# Patient Record
Sex: Male | Born: 1965 | Race: White | Hispanic: No | Marital: Married | Smoking: Former smoker
Health system: Southern US, Community
[De-identification: ages and names within clinical notes are randomized; demographics above are authoritative.]

## PROBLEM LIST (undated history)

## (undated) DIAGNOSIS — F909 Attention-deficit hyperactivity disorder, unspecified type: Secondary | ICD-10-CM

## (undated) DIAGNOSIS — G8929 Other chronic pain: Secondary | ICD-10-CM

## (undated) DIAGNOSIS — M549 Dorsalgia, unspecified: Secondary | ICD-10-CM

## (undated) DIAGNOSIS — F1110 Opioid abuse, uncomplicated: Secondary | ICD-10-CM

## (undated) DIAGNOSIS — F32A Depression, unspecified: Secondary | ICD-10-CM

## (undated) DIAGNOSIS — F329 Major depressive disorder, single episode, unspecified: Secondary | ICD-10-CM

## (undated) HISTORY — DX: Depression, unspecified: F32.A

## (undated) HISTORY — DX: Attention-deficit hyperactivity disorder, unspecified type: F90.9

## (undated) HISTORY — DX: Major depressive disorder, single episode, unspecified: F32.9

## (undated) HISTORY — PX: MEDIAL PARTIAL KNEE REPLACEMENT: SHX5965

## (undated) HISTORY — PX: APPENDECTOMY: SHX54

---

## 2005-02-11 ENCOUNTER — Ambulatory Visit: Payer: Self-pay | Admitting: Internal Medicine

## 2006-04-04 ENCOUNTER — Emergency Department (HOSPITAL_COMMUNITY): Admission: EM | Admit: 2006-04-04 | Discharge: 2006-04-04 | Payer: Self-pay | Admitting: Emergency Medicine

## 2006-04-06 ENCOUNTER — Ambulatory Visit: Payer: Self-pay | Admitting: Internal Medicine

## 2006-04-11 ENCOUNTER — Ambulatory Visit: Payer: Self-pay | Admitting: Internal Medicine

## 2006-04-11 ENCOUNTER — Ambulatory Visit (HOSPITAL_COMMUNITY): Admission: RE | Admit: 2006-04-11 | Discharge: 2006-04-11 | Payer: Self-pay | Admitting: Internal Medicine

## 2006-04-11 ENCOUNTER — Encounter (INDEPENDENT_AMBULATORY_CARE_PROVIDER_SITE_OTHER): Payer: Self-pay | Admitting: Specialist

## 2007-11-17 ENCOUNTER — Ambulatory Visit (HOSPITAL_COMMUNITY): Admission: RE | Admit: 2007-11-17 | Discharge: 2007-11-17 | Payer: Self-pay | Admitting: Preventative Medicine

## 2008-01-10 ENCOUNTER — Encounter (HOSPITAL_COMMUNITY): Admission: RE | Admit: 2008-01-10 | Discharge: 2008-02-09 | Payer: Self-pay | Admitting: Anesthesiology

## 2008-02-12 ENCOUNTER — Encounter (HOSPITAL_COMMUNITY): Admission: RE | Admit: 2008-02-12 | Discharge: 2008-03-13 | Payer: Self-pay | Admitting: Anesthesiology

## 2008-03-15 ENCOUNTER — Encounter (HOSPITAL_COMMUNITY): Admission: RE | Admit: 2008-03-15 | Discharge: 2008-03-27 | Payer: Self-pay | Admitting: Anesthesiology

## 2008-03-28 ENCOUNTER — Encounter: Admission: RE | Admit: 2008-03-28 | Discharge: 2008-04-27 | Payer: Self-pay | Admitting: Anesthesiology

## 2008-12-12 ENCOUNTER — Encounter: Admission: RE | Admit: 2008-12-12 | Discharge: 2008-12-12 | Payer: Self-pay | Admitting: Neurosurgery

## 2010-07-12 ENCOUNTER — Emergency Department (HOSPITAL_COMMUNITY)
Admission: EM | Admit: 2010-07-12 | Discharge: 2010-07-12 | Payer: Self-pay | Source: Home / Self Care | Admitting: Emergency Medicine

## 2010-11-13 NOTE — Consult Note (Signed)
NAME:  Marc [name] [name]             ACCOUNT NO.:  [id number]   MEDICAL RECORD NO.:  [id number]          PATIENT TYPE:  AMB   LOCATION:                                FACILITY:  [facility]   PHYSICIAN:  Lionel December, M.D.    DATE OF BIRTH:  07-14-1965   DATE OF CONSULTATION:  04/06/2006  DATE OF DISCHARGE:                                   CONSULTATION   PRESENTING COMPLAINT:  [name], abdominal pain Marc [name] rectal bleeding.   HISTORY OF PRESENT ILLNESS:  Marc [name] [name] is a 45 year old Caucasian male who is  referred through the courtesy of Dr. Phillips Odor for GI evaluation.   The patient was seen by me on February 11, 2005, for [name], LLQ abdominal  pain Marc [name] [name].  His stool studies were negative.  His CBC was  normal.  He responded to a course of Cipro Marc [name] Flagyl.  We therefore did not  pursue further workup.  He says gradually his symptoms have come back Marc [name]  they have been worse.  He was treated with Cipro Marc [name] Flagyl Marc [name] his symptoms  resolved.  He did have a followup appointment on March 23, 2005, but was  no show.   The patient states his symptoms have been gradually coming back Marc [name] getting  worse.  Now he is having at least 3-5 stools per day.  Stools are loose,  watery.  He has nocturnal [name].  He has crampy pain along the left upper  Marc [name] left lower quadrant of his abdomen.  He passes anywhere from a small to  moderate to large amount of blood with some of his bowel movements.  Bleeding does not occur every day.  He passes fresh blood or clots.  He  denies nausea, vomiting, fever, chills.  He has lost 8 pounds since his last  visit.  He was seen in the emergency room on April 04, 2006.  He had a  normal CBC.  He had an abdominopelvic CT which did not reveal any bowel wall  thickening.  He had tiny bilateral nonobstructing renal calculi.  He is  presently on medical leave which will run out end of this week.   He is on Xanax 1 mg q.h.s..   PAST MEDICAL HISTORY:  History of ADD  diagnosed 11 years ago, Marc [name] he has  been off Adderall for about a year Marc [name] doing well.   He had a laparotomy with drainage of appendiceal abscess in Nov 03, 1990.   ALLERGIES:  No known allergies.   FAMILY HISTORY:  Mother is 84 years old, has heart disease Marc [name] not doing  well.  The patient does not have any siblings.  Father's health status  unknown.   SOCIAL HISTORY:  He is divorced.  He has two children.  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  Previously he was working  at [employer]  He has never smoked cigarettes Marc [name] does not  drink alcohol.   PHYSICAL EXAMINATION:  GENERAL:  Pleasant, well-developed, well-nourished  Caucasian male who is in no acute distress.  He weighs 215 pounds.  He is 5  feet 10-Marc [name]-a-half inches tall.  Pulse 80 per minute, blood pressure 128/76,  temperature is 97.8.  HEENT:  Conjunctivae are pink.  Sclerae are nonicteric.  Oral pharyngeal  mucosa is normal.  No neck masses are noted.  LUNGS:  Clear to auscultation.  CARDIAC:  Regular rhythm.  Normal S1 Marc [name] S2.  No murmur or gallop noted.  ABDOMEN:  Symmetrical, bowel sounds are normal.  Palpation reveals mild  tenderness at LLQ Marc [name] at RLQ.  No organomegaly or masses.  RECTAL:  Deferred, as he had one in emergency room earlier this week.  EXTREMITIES:  No clubbing or edema noted.   CBC from April 04, 2006:  WBC is 6.4; H&H is 15.6 Marc [name] 44.8; platelet count  is 185,000.  Diff is normal, EO count was 2%.   ASSESSMENT:  Marc [name] [name],  Marc [name] [name], Marc [name] [name].  Over a year ago he was evaluated with negative stool studies Marc [name] responded  to empiric trial of Cipro Marc [name] Flagyl.  Given his recurrent symptoms,  inflammatory bowel disease needs to be ruled out although he could have IBS  Marc [name] hemorrhoids to account for this symptomatology.   RECOMMENDATIONS:  1. Dicyclomine 10 mg  before each meal.  Prescription given for #60 with a      refill.  2. Colonoscopy Marc [name] terminal ileoscopy to be performed at [facility] in the near      future.   The patient's medical leave was extended to April 18, 2006.  Hopefully his  evaluation would be complete by then Marc [name] he should be able to return to work  by that time.   We would like to thank Dr. Phillips Odor for the opportunity to participate in the  care of this gentleman.      Lionel December, M.D.  Electronically Signed     NR/MEDQ  D:  04/06/2006  T:  04/07/2006  Job:  [id number]   cc:   Corrie Mckusick, M.D.  Fax: [phone]

## 2010-11-13 NOTE — Op Note (Signed)
NAME:  Marc [name], Marc [name]             ACCOUNT NO.:  [id number]   MEDICAL RECORD NO.:  [id number]          PATIENT TYPE:  AMB   LOCATION:  DAY                           FACILITY:  [facility]   PHYSICIAN:  Lionel December, M.D.    DATE OF BIRTH:  February 17, 1966   DATE OF PROCEDURE:  04/11/2006  DATE OF DISCHARGE:                                 OPERATIVE REPORT   PROCEDURE:  Colonoscopy with polypectomy.   INDICATIONS:  Marc [name] is a 45 year old Caucasian male with diarrhea, left-sided  abdominal pain who was also been having hematochezia.  He is undergoing  diagnostic colonoscopy.  Procedure and risks were reviewed with the patient,  and informed consent was obtained.   MEDICATIONS FOR CONSCIOUS SEDATION:  Demerol 50 mg IV, Versed 8 mg IV.   FINDINGS:  Procedure performed in endoscopy suite.  The patient's vital  signs and O2 saturation were monitored during procedure and remained stable.  The patient was placed in left lateral position and rectal examination  performed.  No abnormality noted on external or digital exam.  Olympus  videoscope was placed in rectum and advanced under vision into sigmoid colon  and beyond.  Preparation was excellent.  Scope was passed into cecum which  was identified by ileocecal valve and appendiceal orifice.  Pictures taken  for the record.  Short segment of TI was also examined and was normal.  Pictures taken for the record.  As the scope was withdrawn, colonic mucosa  was carefully examined.  There were three polyps; a small polyp at proximal  transverse colon which was cold snared and retrieved for histologic  examination using biopsy forceps; there was an 8- to 10-mm polyp at distal  transverse colon which was snared and retrieved for histologic examination;  there was a third large pedunculated polyp with a hemorrhagic surface about  15 mm in diameter which was snared and retrieved for histologic examination.  Mucosa of colon was normal without any abnormalities to  suggest colitis.  Rectal mucosa similarly was normal.  Scope was retroflexed to examine  anorectal junction, and small hemorrhoids were noted below the dentate line.  Endoscope was straightened and withdrawn.  The patient tolerated the  procedure well.   FINAL DIAGNOSIS:  1. No evidence of terminal ileitis or colitis.  2. Three polyps removed; a small one from proximal transverse colon via      cold snare, 8- to 10-mm polyp snared from distal transverse colon and a      15-mm pedunculated polyp with hemorrhagic surface snared from distal      sigmoid colon.  3. Some small external hemorrhoids.   Suspect his rectal bleeding is secondary to this large polyp.  It remains to  be seen whether or not this pain would be alleviated.   I suspect he has irritable bowel syndrome.   RECOMMENDATIONS:  Standard instructions given.  High-fiber diet.  He will  continue dicyclomine 10 mg t.i.d.  I will be contacting patient with results  of biopsy and further recommendations.      Lionel December, M.D.  Electronically Signed  NR/MEDQ  D:  04/11/2006  T:  04/12/2006  Job:  [id number]   cc:   Corrie Mckusick, M.D.  Fax: [phone]

## 2012-12-07 ENCOUNTER — Other Ambulatory Visit (HOSPITAL_COMMUNITY): Payer: Self-pay | Admitting: Family Medicine

## 2012-12-07 ENCOUNTER — Ambulatory Visit (HOSPITAL_COMMUNITY)
Admission: RE | Admit: 2012-12-07 | Discharge: 2012-12-07 | Disposition: A | Discharge status: Home / Self Care | Payer: Self-pay | Source: Ambulatory Visit | Attending: Family Medicine | Admitting: Family Medicine

## 2012-12-07 DIAGNOSIS — M545 Low back pain, unspecified: Secondary | ICD-10-CM | POA: Insufficient documentation

## 2012-12-07 DIAGNOSIS — M4800 Spinal stenosis, site unspecified: Secondary | ICD-10-CM

## 2012-12-07 DIAGNOSIS — M542 Cervicalgia: Secondary | ICD-10-CM | POA: Insufficient documentation

## 2012-12-07 DIAGNOSIS — M47817 Spondylosis without myelopathy or radiculopathy, lumbosacral region: Secondary | ICD-10-CM | POA: Insufficient documentation

## 2012-12-07 DIAGNOSIS — M47812 Spondylosis without myelopathy or radiculopathy, cervical region: Secondary | ICD-10-CM | POA: Insufficient documentation

## 2013-01-02 ENCOUNTER — Emergency Department (HOSPITAL_COMMUNITY)
Admission: EM | Admit: 2013-01-02 | Discharge: 2013-01-02 | Disposition: A | Discharge status: Home / Self Care | Payer: Self-pay | Attending: Emergency Medicine | Admitting: Emergency Medicine

## 2013-01-02 ENCOUNTER — Encounter (HOSPITAL_COMMUNITY): Payer: Self-pay | Admitting: *Deleted

## 2013-01-02 DIAGNOSIS — R5381 Other malaise: Secondary | ICD-10-CM | POA: Insufficient documentation

## 2013-01-02 DIAGNOSIS — F1123 Opioid dependence with withdrawal: Secondary | ICD-10-CM

## 2013-01-02 DIAGNOSIS — F19939 Other psychoactive substance use, unspecified with withdrawal, unspecified: Secondary | ICD-10-CM | POA: Insufficient documentation

## 2013-01-02 DIAGNOSIS — Z79899 Other long term (current) drug therapy: Secondary | ICD-10-CM | POA: Insufficient documentation

## 2013-01-02 DIAGNOSIS — Z87891 Personal history of nicotine dependence: Secondary | ICD-10-CM | POA: Insufficient documentation

## 2013-01-02 DIAGNOSIS — G8929 Other chronic pain: Secondary | ICD-10-CM | POA: Insufficient documentation

## 2013-01-02 DIAGNOSIS — R110 Nausea: Secondary | ICD-10-CM | POA: Insufficient documentation

## 2013-01-02 DIAGNOSIS — R252 Cramp and spasm: Secondary | ICD-10-CM | POA: Insufficient documentation

## 2013-01-02 HISTORY — DX: Other chronic pain: G89.29

## 2013-01-02 HISTORY — DX: Dorsalgia, unspecified: M54.9

## 2013-01-02 HISTORY — DX: Opioid abuse, uncomplicated: F11.10

## 2013-01-02 LAB — BASIC METABOLIC PANEL
BUN: 15 mg/dL (ref 6–23)
CO2: 24 mEq/L (ref 19–32)
Chloride: 101 mEq/L (ref 96–112)
Glucose, Bld: 152 mg/dL — ABNORMAL HIGH (ref 70–99)
Potassium: 3.7 mEq/L (ref 3.5–5.1)

## 2013-01-02 LAB — CBC WITH DIFFERENTIAL/PLATELET
Basophils Relative: 0 % (ref 0–1)
Eosinophils Absolute: 0 10*3/uL (ref 0.0–0.7)
HCT: 45.3 % (ref 39.0–52.0)
Hemoglobin: 15.5 g/dL (ref 13.0–17.0)
MCH: 29.1 pg (ref 26.0–34.0)
MCHC: 34.2 g/dL (ref 30.0–36.0)
MCV: 85 fL (ref 78.0–100.0)
Monocytes Absolute: 0.5 10*3/uL (ref 0.1–1.0)
Monocytes Relative: 6 % (ref 3–12)

## 2013-01-02 LAB — RAPID URINE DRUG SCREEN, HOSP PERFORMED
Cocaine: NOT DETECTED
Opiates: NOT DETECTED
Tetrahydrocannabinol: POSITIVE — AB

## 2013-01-02 LAB — ACETAMINOPHEN LEVEL: Acetaminophen (Tylenol), Serum: <15 ug/mL (ref 10–30)

## 2013-01-02 LAB — SALICYLATE LEVEL: Salicylate Lvl: <2 mg/dL — ABNORMAL LOW (ref 2.8–20.0)

## 2013-01-02 MED ORDER — ACETAMINOPHEN 325 MG PO TABS: 650.0000 mg | ORAL_TABLET | ORAL | Status: DC | PRN

## 2013-01-02 MED ORDER — LORAZEPAM 1 MG PO TABS: 1.0000 mg | ORAL_TABLET | Freq: Three times a day (TID) | ORAL | Status: DC | PRN

## 2013-01-02 MED ORDER — METHOCARBAMOL 500 MG PO TABS: 500.0000 mg | ORAL_TABLET | Freq: Three times a day (TID) | ORAL | Status: DC | PRN

## 2013-01-02 MED ORDER — CLONIDINE HCL 0.1 MG PO TABS
0.1000 mg | ORAL_TABLET | Freq: Once | ORAL | Status: AC
  Administered 2013-01-02: 0.1 mg via ORAL
  Filled 2013-01-02: qty 1

## 2013-01-02 MED ORDER — ONDANSETRON 8 MG PO TBDP
8.0000 mg | ORAL_TABLET | Freq: Once | ORAL | Status: AC
  Administered 2013-01-02: 8 mg via ORAL
  Filled 2013-01-02: qty 1

## 2013-01-02 MED ORDER — CLONIDINE HCL 0.1 MG PO TABS
0.1000 mg | ORAL_TABLET | Freq: Four times a day (QID) | ORAL | Status: DC
  Administered 2013-01-02: 0.1 mg via ORAL
  Filled 2013-01-02: qty 1

## 2013-01-02 MED ORDER — CLONIDINE HCL 0.1 MG PO TABS: 0.1000 mg | ORAL_TABLET | ORAL | Status: DC

## 2013-01-02 MED ORDER — LOPERAMIDE HCL 2 MG PO CAPS: 2.0000 mg | ORAL_CAPSULE | ORAL | Status: DC | PRN

## 2013-01-02 MED ORDER — HYDROXYZINE HCL 25 MG PO TABS: 25.0000 mg | ORAL_TABLET | Freq: Four times a day (QID) | ORAL | Status: DC | PRN

## 2013-01-02 MED ORDER — IBUPROFEN 400 MG PO TABS: 400.0000 mg | ORAL_TABLET | Freq: Three times a day (TID) | ORAL | Status: DC | PRN

## 2013-01-02 MED ORDER — NICOTINE 21 MG/24HR TD PT24: 21.0000 mg | MEDICATED_PATCH | Freq: Every day | TRANSDERMAL | Status: DC | PRN

## 2013-01-02 MED ORDER — CLONIDINE HCL 0.1 MG PO TABS: 0.1000 mg | ORAL_TABLET | Freq: Every day | ORAL | Status: DC

## 2013-01-02 MED ORDER — ALUM & MAG HYDROXIDE-SIMETH 200-200-20 MG/5ML PO SUSP: 30.0000 mL | ORAL | Status: DC | PRN

## 2013-01-02 MED ORDER — ONDANSETRON HCL 4 MG PO TABS: 4.0000 mg | ORAL_TABLET | Freq: Three times a day (TID) | ORAL | Status: DC | PRN

## 2013-01-02 MED ORDER — DICYCLOMINE HCL 10 MG PO CAPS: 20.0000 mg | ORAL_CAPSULE | Freq: Four times a day (QID) | ORAL | Status: DC | PRN

## 2013-01-02 NOTE — BH Assessment (Signed)
Assessment Note   Marc [name] is an 47 y.o. male. PT REPORTED TO THE ER SEEKING OPIATE DETOX.  HE REPORTS HIS ONLY WITHDRAWAL IS BODY ACHES.  HE HAS NOT USED IN 2 DAYS AND WOULD PREFER A SUBOXEN PROGRAM OR OUT PT TREATMENT.  PT MADE AWARE OF SUBOXEN PROGRAMS WITH DR REDDY AND THE METHADONE PROGRAM.  PT REPORTS HE WOULD GO BACK TO [facility] FOR THE GROUP THERAPY THAT DR Duayne Cal AT [facility] HAS ALREADY RECOMMENDED FOR HIM.  PT DENIES S/I, H/I AND IS NOT PSYCHOTIC.        Axis I: Substance Abuse, OPIATE DEPENDENCE Axis II: Deferred Axis III:  Past Medical History  Diagnosis Date  . Back pain   . Chronic pain   . Opiate abuse, continuous    Axis IV: problems with access to health care services Axis V: 51-60 moderate symptoms  Past Medical History:  Past Medical History  Diagnosis Date  . Back pain   . Chronic pain   . Opiate abuse, continuous     Past Surgical History  Procedure Laterality Date  . Appendectomy    . Medial partial knee replacement Left     Family History: History reviewed. No pertinent family history.  Social History:  reports that he has quit smoking. His smokeless tobacco use includes Snuff. He reports that he uses illicit drugs (Marijuana). He reports that he does not drink alcohol.  Additional Social History:  Alcohol / Drug Use Pain Medications: yes Prescriptions: yes Over the Counter: no History of alcohol / drug use?: Yes Substance #1 Name of Substance 1: opiates 1 - Age of First Use: 43 1 - Amount (size/oz): 8-10 hydrocodone 1 - Frequency: daily 1 - Duration: 3 yrs 1 - Last Use / Amount: 2 days ago  CIWA: CIWA-Ar BP: 115/90 mmHg Pulse Rate: 92 COWS:    Allergies: No Known Allergies  Home Medications:  (Not in a hospital admission)  OB/GYN Status:  No LMP for male patient.  General Assessment Data Location of Assessment: [facility] ED ACT Assessment: Yes Living Arrangements: Spouse/significant other Can pt return to current living  arrangement?: Yes Admission Status: Voluntary Is patient capable of signing voluntary admission?: Yes Transfer from: Acute Hospital Referral Source: MD     Risk to self Suicidal Ideation: No Suicidal Intent: No Is patient at risk for suicide?: No Suicidal Plan?: No Access to Means: No What has been your use of drugs/alcohol within the last 12 months?: opiates Previous Attempts/Gestures: No How many times?: 0 Other Self Harm Risks: na Triggers for Past Attempts: None known Intentional Self Injurious Behavior: None Family Suicide History: Unknown Recent stressful life event(s): Recent negative physical changes (chronic back pain  worse at times) Persecutory voices/beliefs?: No Depression: Yes Depression Symptoms: Loss of interest in usual pleasures;Isolating;Fatigue Substance abuse history and/or treatment for substance abuse?: Yes Suicide prevention information given to non-admitted patients: Yes  Risk to Others Homicidal Ideation: No Thoughts of Harm to Others: No Current Homicidal Intent: No Current Homicidal Plan: No Access to Homicidal Means: No History of harm to others?: No Assessment of Violence: None Noted Does patient have access to weapons?: No Criminal Charges Pending?: No Does patient have a court date: No  Psychosis Hallucinations: None noted Delusions: None noted  Mental Status Report Appear/Hygiene: Improved Eye Contact: Good Motor Activity: Freedom of movement Speech: Logical/coherent Level of Consciousness: Alert Mood: Depressed Affect: Appropriate to circumstance;Depressed Anxiety Level: Minimal Thought Processes: Coherent;Relevant Judgement: Unimpaired Orientation: Person;Place;Time;Situation Obsessive Compulsive Thoughts/Behaviors: None  Cognitive Functioning Concentration: Normal Memory: Recent Intact;Remote Intact IQ: Average Insight: Fair Impulse Control: Poor Appetite: Good Weight Loss: 0 Weight Gain: 0 Sleep: No Change Total  Hours of Sleep: 8 (with xanax) Vegetative Symptoms: None  ADLScreening [facility] Assessment Services) Patient's cognitive ability adequate to safely complete daily activities?: Yes Patient able to express need for assistance with ADLs?: Yes Independently performs ADLs?: No  Abuse/Neglect [facility]) Physical Abuse: Denies Verbal Abuse: Denies Sexual Abuse: Denies  Prior Inpatient Therapy Prior Inpatient Therapy: No Prior Therapy Dates: na Prior Therapy Facilty/Provider(s): na  Prior Outpatient Therapy Prior Outpatient Therapy: Yes Prior Therapy Dates: currently Prior Therapy Facilty/Provider(s): [facility] Reason for Treatment: depression and addiction pxs  ADL Screening (condition at time of admission) Patient's cognitive ability adequate to safely complete daily activities?: Yes Is the patient deaf or have difficulty hearing?: Yes Does the patient have difficulty seeing, even when wearing glasses/contacts?: Yes Does the patient have difficulty concentrating, remembering, or making decisions?: Yes Patient able to express need for assistance with ADLs?: Yes Does the patient have difficulty dressing or bathing?: Yes Independently performs ADLs?: No Weakness of Legs: None Weakness of Arms/Hands: None  Home Assistive Devices/Equipment Home Assistive Devices/Equipment: None  Therapy Consults (therapy consults require a physician order) PT Evaluation Needed: No OT Evalulation Needed: No SLP Evaluation Needed: No Abuse/Neglect Assessment (Assessment to be complete while patient is alone) Physical Abuse: Denies Verbal Abuse: Denies Sexual Abuse: Denies Exploitation of patient/patient's resources: Denies Self-Neglect: Denies Values / Beliefs Cultural Requests During Hospitalization: None Spiritual Requests During Hospitalization: None Consults Spiritual Care Consult Needed: No Social Work Consult Needed: No Merchant navy officer (For Healthcare) Advance Directive: Patient does not have  advance directive;Patient would not like information Pre-existing out of facility DNR order (yellow form or pink MOST form): No    Additional Information 1:1 In Past 12 Months?: No CIRT Risk: No Elopement Risk: No Does patient have medical clearance?: Yes     Disposition:  Disposition Initial Assessment Completed for this Encounter: Yes Disposition of Patient: Referred to Patient referred to: Other (Comment) [facility])  On Site Evaluation by:   Reviewed with Physician:     Marc [name] 01/02/2013 7:19 PM

## 2013-01-02 NOTE — ED Notes (Signed)
Patient seeking help w/rehab from Rx pain medication/marijuana.  Has been on pain meds for 4 years, originally for back pain.

## 2013-01-02 NOTE — ED Provider Notes (Signed)
History    CSN: [id number] Arrival date & time 01/02/13  1327  First MD Initiated Contact with Patient 01/02/13 1404     Chief Complaint  Patient presents with  . Medical Clearance    HPI Pt was seen at 1400. Per pt and his wife, c/o gradual onset and persistence of constant opiate abuse for the past 4 years. Pt states he has been abusing his chronic pain medication prescriptions (vicodin, percocet).  Pt is here requesting detox. States his LD opiates was 2 days ago. Endorses generalized fatigue, muscles "cramping," and nausea. States he "can't get up off the couch" and "I can't do this on my own."  Denies SI, no SA, no HI, no hallucinations. Denies vomiting/diarrhea, no CP/SOB, no abd pain.    Past Medical History  Diagnosis Date  . Back pain   . Chronic pain   . Opiate abuse, continuous    Past Surgical History  Procedure Laterality Date  . Appendectomy    . Medial partial knee replacement Left     History  Substance Use Topics  . Smoking status: Former Games developer  . Smokeless tobacco: Current User    Types: Snuff  . Alcohol Use: No    Review of Systems ROS: Statement: All systems negative except as marked or noted in the HPI; Constitutional: Negative for fever and chills. ; ; Eyes: Negative for eye pain, redness and discharge. ; ; ENMT: Negative for ear pain, hoarseness, nasal congestion, sinus pressure and sore throat. ; ; Cardiovascular: Negative for chest pain, palpitations, diaphoresis, dyspnea and peripheral edema. ; ; Respiratory: Negative for cough, wheezing and stridor. ; ; Gastrointestinal: +nausea. Negative for vomiting, diarrhea, abdominal pain, blood in stool, hematemesis, jaundice and rectal bleeding. . ; ; Genitourinary: Negative for dysuria, flank pain and hematuria. ; ; Musculoskeletal: Negative for back pain and neck pain. Negative for swelling and trauma.; ; Skin: Negative for pruritus, rash, abrasions, blisters, bruising and skin lesion.; ; Neuro: Negative for  headache, lightheadedness and neck stiffness. Negative for weakness, altered level of consciousness , altered mental status, extremity weakness, paresthesias, involuntary movement, seizure and syncope.; Psych:  No SI, no SA, no HI, no hallucinations.      Allergies  Review of patient's allergies indicates no known allergies.  Home Medications   Current Outpatient Rx  Name  Route  Sig  Dispense  Refill  . ALPRAZolam (XANAX) 1 MG tablet   Oral   Take 1-2 mg by mouth 3 (three) times daily.         . DULoxetine (CYMBALTA) 60 MG capsule   Oral   Take 60 mg by mouth daily.          BP 115/90  Pulse 92  Temp(Src) 97.7 F (36.5 C) (Oral)  Resp 20  Ht 5\' 10"  (1.778 m)  Wt 227 lb 2 oz (103.023 kg)  BMI 32.59 kg/m2  SpO2 100% Physical Exam Physical examination:  Nursing notes reviewed; Vital signs and O2 SAT reviewed;  Constitutional: Well developed, Well nourished, Well hydrated, In no acute distress; Head:  Normocephalic, atraumatic; Eyes: EOMI, PERRL, No scleral icterus; ENMT: Mouth and pharynx normal, Mucous membranes moist; Neck: Supple, Full range of motion; Cardiovascular: Regular rate and rhythm; Respiratory: Breath sounds clear, no wheezes.  Speaking full sentences with ease, Normal respiratory effort/excursion; Chest: No deformity, Movement normal; Abdomen: Nondistended, Normal bowel sounds;; Extremities:  No deformity. No edema, No calf edema or asymmetry.; Neuro: AA&Ox3, Major CN grossly intact.  Speech clear. No  gross focal motor or sensory deficits in extremities.; Skin: Color normal, Warm, Dry.; Psych:  Affect flat, poor eye contact. Denies SI, no SA.    ED Course  Procedures   MDM  MDM Reviewed: previous chart, nursing note and vitals Interpretation: labs   Results for orders placed during the hospital encounter of 01/02/13  ACETAMINOPHEN LEVEL      Result Value Range   Acetaminophen (Tylenol), Serum <15.0  10 - 30 ug/mL  SALICYLATE LEVEL      Result Value  Range   Salicylate Lvl <2.0 (*) 2.8 - 20.0 mg/dL  ETHANOL      Result Value Range   Alcohol, Ethyl (B) <11  0 - 11 mg/dL  BASIC METABOLIC PANEL      Result Value Range   Sodium 137  135 - 145 mEq/L   Potassium 3.7  3.5 - 5.1 mEq/L   Chloride 101  96 - 112 mEq/L   CO2 24  19 - 32 mEq/L   Glucose, Bld 152 (*) 70 - 99 mg/dL   BUN 15  6 - 23 mg/dL   Creatinine, Ser 1.61  0.50 - 1.35 mg/dL   Calcium 9.8  8.4 - 09.6 mg/dL   GFR calc non Af Amer >90  >90 mL/min   GFR calc Af Amer >90  >90 mL/min  CBC WITH DIFFERENTIAL      Result Value Range   WBC 8.8  4.0 - 10.5 K/uL   RBC 5.33  4.22 - 5.81 MIL/uL   Hemoglobin 15.5  13.0 - 17.0 g/dL   HCT 04.5  40.9 - 81.1 %   MCV 85.0  78.0 - 100.0 fL   MCH 29.1  26.0 - 34.0 pg   MCHC 34.2  30.0 - 36.0 g/dL   RDW 91.4  78.2 - 95.6 %   Platelets 224  150 - 400 K/uL   Neutrophils Relative % 77  43 - 77 %   Neutro Abs 6.7  1.7 - 7.7 K/uL   Lymphocytes Relative 17  12 - 46 %   Lymphs Abs 1.5  0.7 - 4.0 K/uL   Monocytes Relative 6  3 - 12 %   Monocytes Absolute 0.5  0.1 - 1.0 K/uL   Eosinophils Relative 0  0 - 5 %   Eosinophils Absolute 0.0  0.0 - 0.7 K/uL   Basophils Relative 0  0 - 1 %   Basophils Absolute 0.0  0.0 - 0.1 K/uL     1615:  ACT Ella to eval. Clonidine and zofran given. Holding orders and clonidine detox protocol ordered.   Laray Anger, DO 01/02/13 [phone]

## 2013-11-30 ENCOUNTER — Other Ambulatory Visit (HOSPITAL_COMMUNITY): Payer: Self-pay | Admitting: Physician Assistant

## 2013-11-30 DIAGNOSIS — G8929 Other chronic pain: Secondary | ICD-10-CM

## 2013-11-30 DIAGNOSIS — M5412 Radiculopathy, cervical region: Secondary | ICD-10-CM

## 2013-12-03 ENCOUNTER — Ambulatory Visit (HOSPITAL_COMMUNITY)
Admission: RE | Admit: 2013-12-03 | Discharge: 2013-12-03 | Disposition: A | Discharge status: Home / Self Care | Payer: 59 | Source: Ambulatory Visit | Attending: Physician Assistant | Admitting: Physician Assistant

## 2013-12-03 DIAGNOSIS — M5412 Radiculopathy, cervical region: Secondary | ICD-10-CM

## 2013-12-03 DIAGNOSIS — G8929 Other chronic pain: Secondary | ICD-10-CM

## 2013-12-03 DIAGNOSIS — M79609 Pain in unspecified limb: Secondary | ICD-10-CM | POA: Insufficient documentation

## 2013-12-03 DIAGNOSIS — M542 Cervicalgia: Secondary | ICD-10-CM | POA: Insufficient documentation

## 2013-12-03 DIAGNOSIS — M5380 Other specified dorsopathies, site unspecified: Secondary | ICD-10-CM | POA: Insufficient documentation

## 2013-12-03 DIAGNOSIS — M5020 Other cervical disc displacement, unspecified cervical region: Secondary | ICD-10-CM | POA: Insufficient documentation

## 2013-12-06 ENCOUNTER — Ambulatory Visit (INDEPENDENT_AMBULATORY_CARE_PROVIDER_SITE_OTHER): Payer: 59 | Admitting: Orthopedic Surgery

## 2013-12-06 ENCOUNTER — Ambulatory Visit (INDEPENDENT_AMBULATORY_CARE_PROVIDER_SITE_OTHER): Payer: 59

## 2013-12-06 DIAGNOSIS — M2390 Unspecified internal derangement of unspecified knee: Secondary | ICD-10-CM

## 2013-12-06 DIAGNOSIS — M25562 Pain in left knee: Secondary | ICD-10-CM

## 2013-12-06 DIAGNOSIS — M25569 Pain in unspecified knee: Secondary | ICD-10-CM
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
## 2013-12-06 NOTE — Patient Instructions (Addendum)
MRI LEFT KNEE 

## 2013-12-31 ENCOUNTER — Ambulatory Visit (HOSPITAL_COMMUNITY): Payer: 59

## 2014-01-01 ENCOUNTER — Telehealth: Payer: Self-pay | Admitting: *Deleted

## 2014-01-01 ENCOUNTER — Ambulatory Visit (HOSPITAL_COMMUNITY): Payer: 59

## 2014-01-01 NOTE — Telephone Encounter (Signed)
NO

## 2014-01-01 NOTE — Telephone Encounter (Signed)
Call received from patient, who states he went for his MRI last night and started having back spasms and could not lay there. He is already on xanax 1 mg, and had taken that before he went last night. He was rescheduled for tonight, and is asking is there something else you can give him to help with the back spasms? Please advise

## 2014-01-01 NOTE — Telephone Encounter (Signed)
Patient informed of Dr. Harrison's reply. 

## 2014-01-02 ENCOUNTER — Telehealth: Payer: Self-pay | Admitting: Orthopedic Surgery

## 2014-01-02 NOTE — Telephone Encounter (Signed)
Patient called back, states was unable to go through the (re-scheduled) MRI at [facility] for left knee, due to back spasms, as mentioned in previous note.   He said he would like to hold on re-scheduling MRI, and said he feels he needs to check into having a spine specialist see him for his back spasms.  He has also cancelled his follow up appointment for tomorrow, 01/03/14, and said he will call back if wishes to re-schedule.

## 2014-01-03 ENCOUNTER — Ambulatory Visit: Payer: 59 | Admitting: Orthopedic Surgery

## 2014-01-03 NOTE — Telephone Encounter (Signed)
Noted  

## 2014-07-26 ENCOUNTER — Ambulatory Visit (HOSPITAL_COMMUNITY): Payer: Self-pay | Admitting: Psychiatry

## 2014-08-23 ENCOUNTER — Ambulatory Visit (HOSPITAL_COMMUNITY): Payer: Self-pay | Admitting: Psychiatry

## 2014-08-30 ENCOUNTER — Encounter (HOSPITAL_COMMUNITY): Payer: Self-pay | Admitting: Psychiatry

## 2014-08-30 ENCOUNTER — Telehealth (HOSPITAL_COMMUNITY): Payer: Self-pay | Admitting: *Deleted

## 2014-08-30 ENCOUNTER — Ambulatory Visit (INDEPENDENT_AMBULATORY_CARE_PROVIDER_SITE_OTHER): Payer: 59 | Admitting: Psychiatry

## 2014-08-30 VITALS — BP 126/78 | HR 75 | Ht 70.0 in | Wt 227.8 lb

## 2014-08-30 DIAGNOSIS — F909 Attention-deficit hyperactivity disorder, unspecified type: Secondary | ICD-10-CM | POA: Insufficient documentation

## 2014-08-30 DIAGNOSIS — F900 Attention-deficit hyperactivity disorder, predominantly inattentive type: Secondary | ICD-10-CM

## 2014-08-30 DIAGNOSIS — F329 Major depressive disorder, single episode, unspecified: Secondary | ICD-10-CM

## 2014-08-30 MED ORDER — AMPHETAMINE-DEXTROAMPHETAMINE 20 MG PO TABS: 20.0000 mg | ORAL_TABLET | Freq: Two times a day (BID) | ORAL | Status: DC

## 2014-08-30 NOTE — Telephone Encounter (Signed)
phone call from patient.   pharmacy said he need authorization for his Adderall.    Authorization phone # [phone].

## 2014-08-30 NOTE — Telephone Encounter (Signed)
Route to [place] please

## 2014-08-30 NOTE — Telephone Encounter (Signed)
Called pt and informed him that we will get started on his Prior Auth for his Adderall and pt shows understanding
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
## 2014-09-02 ENCOUNTER — Telehealth (HOSPITAL_COMMUNITY): Payer: Self-pay | Admitting: *Deleted

## 2014-09-02 NOTE — Telephone Encounter (Signed)
Prior authorization received for Amphetamine-Dextroamphetamine. Entered authorization with covermymeds.

## 2014-09-27 ENCOUNTER — Ambulatory Visit (INDEPENDENT_AMBULATORY_CARE_PROVIDER_SITE_OTHER): Payer: 59 | Admitting: Psychiatry

## 2014-09-27 ENCOUNTER — Encounter (HOSPITAL_COMMUNITY): Payer: Self-pay | Admitting: Psychiatry

## 2014-09-27 VITALS — BP 138/80 | Ht 70.0 in | Wt 219.0 lb

## 2014-09-27 DIAGNOSIS — F900 Attention-deficit hyperactivity disorder, predominantly inattentive type: Secondary | ICD-10-CM | POA: Diagnosis not present

## 2014-09-27 DIAGNOSIS — F329 Major depressive disorder, single episode, unspecified: Secondary | ICD-10-CM | POA: Diagnosis not present

## 2014-09-27 MED ORDER — AMPHETAMINE-DEXTROAMPHETAMINE 30 MG PO TABS: 30.0000 mg | ORAL_TABLET | Freq: Two times a day (BID) | ORAL | Status: DC
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
## 2014-10-03 ENCOUNTER — Telehealth (HOSPITAL_COMMUNITY): Payer: Self-pay | Admitting: *Deleted

## 2014-10-03 NOTE — Telephone Encounter (Signed)
Prior authorization received for amphetamine-dextroamphetamine. Completed online through covermymeds.

## 2014-10-08 ENCOUNTER — Telehealth (HOSPITAL_COMMUNITY): Payer: Self-pay | Admitting: *Deleted

## 2014-10-08 NOTE — Telephone Encounter (Signed)
Had to call covermymeds to resend Authorization. Will have response in 24 hours.

## 2014-10-22 ENCOUNTER — Telehealth (HOSPITAL_COMMUNITY): Payer: Self-pay | Admitting: *Deleted

## 2014-10-22 NOTE — Telephone Encounter (Signed)
Received a denial letter for Adderall and called to re-check since an approval had been given on 4/14. Spoke with Britta MccreedyBarbara who states it was approved and that was for an older request on 4/7. Prior authorization approval # [id number] will expire on 09/02/17. Pharmacy has sent the paid claim and they will be able to see approval.

## 2014-10-25 ENCOUNTER — Encounter (HOSPITAL_COMMUNITY): Payer: Self-pay | Admitting: Psychiatry

## 2014-10-25 ENCOUNTER — Ambulatory Visit (INDEPENDENT_AMBULATORY_CARE_PROVIDER_SITE_OTHER): Payer: 59 | Admitting: Psychiatry

## 2014-10-25 VITALS — BP 118/81 | HR 81 | Ht 70.0 in | Wt 209.0 lb

## 2014-10-25 DIAGNOSIS — F900 Attention-deficit hyperactivity disorder, predominantly inattentive type: Secondary | ICD-10-CM

## 2014-10-25 DIAGNOSIS — F329 Major depressive disorder, single episode, unspecified: Secondary | ICD-10-CM

## 2014-10-25 MED ORDER — AMPHETAMINE-DEXTROAMPHETAMINE 30 MG PO TABS: 30.0000 mg | ORAL_TABLET | Freq: Two times a day (BID) | ORAL | Status: DC
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
## 2014-10-28 ENCOUNTER — Encounter (HOSPITAL_COMMUNITY): Payer: Self-pay | Admitting: *Deleted

## 2014-10-28 ENCOUNTER — Ambulatory Visit (HOSPITAL_COMMUNITY): Payer: Self-pay | Admitting: Psychology

## 2014-11-21 ENCOUNTER — Ambulatory Visit (INDEPENDENT_AMBULATORY_CARE_PROVIDER_SITE_OTHER): Payer: 59 | Admitting: Psychology

## 2014-11-21 DIAGNOSIS — F331 Major depressive disorder, recurrent, moderate: Secondary | ICD-10-CM | POA: Diagnosis not present

## 2014-11-21 DIAGNOSIS — F900 Attention-deficit hyperactivity disorder, predominantly inattentive type: Secondary | ICD-10-CM

## 2014-12-03 ENCOUNTER — Other Ambulatory Visit (HOSPITAL_COMMUNITY): Payer: Self-pay | Admitting: Orthopedic Surgery

## 2014-12-03 DIAGNOSIS — M5441 Lumbago with sciatica, right side: Secondary | ICD-10-CM

## 2014-12-03 DIAGNOSIS — M5442 Lumbago with sciatica, left side: Principal | ICD-10-CM

## 2014-12-16 ENCOUNTER — Other Ambulatory Visit (HOSPITAL_COMMUNITY): Payer: Self-pay

## 2014-12-18 ENCOUNTER — Other Ambulatory Visit (HOSPITAL_COMMUNITY): Payer: Self-pay | Admitting: Respiratory Therapy

## 2014-12-18 DIAGNOSIS — G4710 Hypersomnia, unspecified: Secondary | ICD-10-CM

## 2014-12-18 DIAGNOSIS — G4730 Sleep apnea, unspecified: Secondary | ICD-10-CM

## 2014-12-23 ENCOUNTER — Telehealth (HOSPITAL_COMMUNITY): Payer: Self-pay | Admitting: *Deleted

## 2014-12-23 ENCOUNTER — Ambulatory Visit (HOSPITAL_COMMUNITY): Payer: Self-pay | Admitting: Psychology

## 2015-01-03 ENCOUNTER — Other Ambulatory Visit (HOSPITAL_COMMUNITY): Payer: Self-pay | Admitting: Psychiatry

## 2015-01-03 ENCOUNTER — Telehealth (HOSPITAL_COMMUNITY): Payer: Self-pay | Admitting: *Deleted

## 2015-01-03 ENCOUNTER — Ambulatory Visit (HOSPITAL_COMMUNITY): Payer: Self-pay | Admitting: Psychology

## 2015-01-03 MED ORDER — AMPHETAMINE-DEXTROAMPHETAMINE 30 MG PO TABS: 30.0000 mg | ORAL_TABLET | Freq: Two times a day (BID) | ORAL | Status: DC

## 2015-01-03 NOTE — Telephone Encounter (Signed)
reprinted

## 2015-01-03 NOTE — Telephone Encounter (Signed)
Pt is aware script is ready for pick up. Per pt he will pick script up on Monday 01-06-15

## 2015-01-03 NOTE — Telephone Encounter (Signed)
He only got 30 pills Adderall, he normally get 60 pills.

## 2015-01-06 ENCOUNTER — Other Ambulatory Visit (HOSPITAL_COMMUNITY): Payer: Self-pay | Admitting: Respiratory Therapy

## 2015-01-09 ENCOUNTER — Ambulatory Visit (INDEPENDENT_AMBULATORY_CARE_PROVIDER_SITE_OTHER): Payer: 59 | Admitting: Psychiatry

## 2015-01-09 ENCOUNTER — Encounter (HOSPITAL_COMMUNITY): Payer: Self-pay | Admitting: Psychiatry

## 2015-01-09 VITALS — BP 130/84 | Ht 70.0 in | Wt 200.0 lb

## 2015-01-09 DIAGNOSIS — F900 Attention-deficit hyperactivity disorder, predominantly inattentive type: Secondary | ICD-10-CM

## 2015-01-09 DIAGNOSIS — F329 Major depressive disorder, single episode, unspecified: Secondary | ICD-10-CM | POA: Diagnosis not present

## 2015-01-09 MED ORDER — AMPHETAMINE-DEXTROAMPHETAMINE 30 MG PO TABS: 30.0000 mg | ORAL_TABLET | Freq: Two times a day (BID) | ORAL | Status: DC
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
## 2015-01-16 ENCOUNTER — Ambulatory Visit: Payer: 59 | Attending: Neurology | Admitting: Sleep Medicine

## 2015-01-16 DIAGNOSIS — G4710 Hypersomnia, unspecified: Secondary | ICD-10-CM

## 2015-01-16 DIAGNOSIS — G4730 Sleep apnea, unspecified: Secondary | ICD-10-CM

## 2015-01-20 ENCOUNTER — Other Ambulatory Visit (HOSPITAL_COMMUNITY): Payer: Self-pay | Admitting: Physical Medicine and Rehabilitation

## 2015-01-20 DIAGNOSIS — G894 Chronic pain syndrome: Secondary | ICD-10-CM

## 2015-01-24 ENCOUNTER — Ambulatory Visit (HOSPITAL_COMMUNITY): Payer: Self-pay | Admitting: Psychiatry

## 2015-01-25 ENCOUNTER — Encounter: Payer: Self-pay | Admitting: Neurology

## 2015-01-25 NOTE — Sleep Study (Signed)
  [facility] Giovanie Lefebre A. Gerilyn Pilgrim, MD     [email or web address]             NOCTURNAL POLYSOMNOGRAPHY   LOCATION: [facility]   Patient Name: Marc, [name] Date: 01/16/2015 Gender: Male D.O.B: Oct 27, 1965 Age (years): 41 Referring Provider: Beryle Beams MD, ABSM Height (inches): 70 Interpreting Physician: Beryle Beams MD, ABSM Weight (lbs): 198 RPSGT: Sharee Holster BMI: 28 MRN: [id number] Neck Size: 16.50 CLINICAL INFORMATION Sleep Study Type: NPSG  Indication for sleep study: N/A  Epworth Sleepiness Score: 9  SLEEP STUDY TECHNIQUE As per the AASM Manual for the Scoring of Sleep and Associated Events v2.3 (April 2016) with a hypopnea requiring 4% desaturations.  The channels recorded and monitored were frontal, central and occipital EEG, electrooculogram (EOG), submentalis EMG (chin), nasal and oral airflow, thoracic and abdominal wall motion, anterior tibialis EMG, snore microphone, electrocardiogram, and pulse oximetry.  MEDICATIONS Patient's medications include: N/A. Medications self-administered by patient during sleep study : No sleep medicine administered.  SLEEP ARCHITECTURE The study was initiated at 10:58:56 PM and ended at 4:59:54 AM.  Sleep onset time was 17.3 minutes and the sleep efficiency was 77.4%. The total sleep time was 279.5 minutes.  Stage REM latency was 97.5 minutes.  The patient spent 9.84% of the night in stage N1 sleep, 68.87% in stage N2 sleep, 0.00% in stage N3 and 21.29% in REM.  Alpha intrusion was absent.  Supine sleep was 100.00%.  RESPIRATORY PARAMETERS The overall apnea/hypopnea index (AHI) was 2.6 per hour. There were 2 total apneas, including 0 obstructive, 2 central and 0 mixed apneas. There were 10 hypopneas and 3 RERAs.  The AHI during Stage REM sleep was 0.0 per hour.  AHI while supine was 2.6 per hour.  The mean oxygen saturation was 92.98%. The minimum SpO2 during sleep was 88.00%.  Moderate  snoring was noted during this study.  CARDIAC DATA The 2 lead EKG demonstrated sinus rhythm. The mean heart rate was 61.23 beats per minute. Other EKG findings include: None. LEG MOVEMENT DATA The total PLMS were 0 with a resulting PLMS index of 0.00. Associated arousal with leg movement index was 0.0 .  IMPRESSIONS No significant obstructive sleep apnea occurred during this study. No significant central sleep apnea occurred during this study. Severe oxygen desaturation was noted during this study. The patient snored with Moderate snoring volume. No cardiac abnormalities were noted during this study. Clinically significant periodic limb movements did not occur during sleep.     Argie Ramming, MD Diplomate, American Board of Sleep Medicine.

## 2015-01-25 NOTE — Progress Notes (Signed)
Patient ID: QUASHAWN JEWKES, male   DOB: Jan 20, 1966, 49 y.o.   MRN: [id number]   [facility] [name] A. Gerilyn Pilgrim, MD     [email or web address]             NOCTURNAL POLYSOMNOGRAPHY   LOCATION: [facility]  Patient Name: Marc [name], Marc [name] Date: 01/16/2015 Gender: Male D.O.B: 1966-06-26 Age (years): 27 Referring Provider: Beryle Beams MD, ABSM Height (inches): 70 Interpreting Physician: Beryle Beams MD, ABSM Weight (lbs): 198 RPSGT: Sharee Holster BMI: 28 MRN: [id number] Neck Size: 16.50 CLINICAL INFORMATION Sleep Study Type: NPSG  Indication for sleep study: N/A  Epworth Sleepiness Score: 9  SLEEP STUDY TECHNIQUE As per the AASM Manual for the Scoring of Sleep and Associated Events v2.3 (April 2016) with a hypopnea requiring 4% desaturations.  The channels recorded and monitored were frontal, central and occipital EEG, electrooculogram (EOG), submentalis EMG (chin), nasal and oral airflow, thoracic and abdominal wall motion, anterior tibialis EMG, snore microphone, electrocardiogram, and pulse oximetry.  MEDICATIONS Patient's medications include: N/A. Medications self-administered by patient during sleep study : No sleep medicine administered.  SLEEP ARCHITECTURE The study was initiated at 10:58:56 PM and ended at 4:59:54 AM.  Sleep onset time was 17.3 minutes and the sleep efficiency was 77.4%. The total sleep time was 279.5 minutes.  Stage REM latency was 97.5 minutes.  The patient spent 9.84% of the night in stage N1 sleep, 68.87% in stage N2 sleep, 0.00% in stage N3 and 21.29% in REM.  Alpha intrusion was absent.  Supine sleep was 100.00%.  RESPIRATORY PARAMETERS The overall apnea/hypopnea index (AHI) was 2.6 per hour. There were 2 total apneas, including 0 obstructive, 2 central and 0 mixed apneas. There were 10 hypopneas and 3 RERAs.  The AHI during Stage REM sleep was 0.0 per hour.  AHI while supine was 2.6 per hour.  The mean oxygen  saturation was 92.98%. The minimum SpO2 during sleep was 88.00%.  Moderate snoring was noted during this study.  CARDIAC DATA The 2 lead EKG demonstrated sinus rhythm. The mean heart rate was 61.23 beats per minute. Other EKG findings include: None. LEG MOVEMENT DATA The total PLMS were 0 with a resulting PLMS index of 0.00. Associated arousal with leg movement index was 0.0 .  IMPRESSIONS No significant obstructive sleep apnea occurred during this study. No significant central sleep apnea occurred during this study. The patient snored with Moderate snoring volume. No cardiac abnormalities were noted during this study. Clinically significant periodic limb movements did not occur during sleep.      Argie Ramming, MD Diplomate, American Board of Sleep Medicine.

## 2015-01-29 ENCOUNTER — Ambulatory Visit (HOSPITAL_COMMUNITY): Payer: 59

## 2015-02-11 ENCOUNTER — Ambulatory Visit (HOSPITAL_COMMUNITY)
Admission: RE | Admit: 2015-02-11 | Discharge: 2015-02-11 | Disposition: A | Discharge status: Home / Self Care | Payer: 59 | Source: Ambulatory Visit | Attending: Physical Medicine and Rehabilitation | Admitting: Physical Medicine and Rehabilitation

## 2015-02-11 DIAGNOSIS — M5022 Other cervical disc displacement, mid-cervical region: Secondary | ICD-10-CM | POA: Insufficient documentation

## 2015-02-11 DIAGNOSIS — R531 Weakness: Secondary | ICD-10-CM | POA: Diagnosis not present

## 2015-02-11 DIAGNOSIS — M546 Pain in thoracic spine: Secondary | ICD-10-CM | POA: Diagnosis present

## 2015-02-11 DIAGNOSIS — G894 Chronic pain syndrome: Secondary | ICD-10-CM

## 2015-03-05 ENCOUNTER — Telehealth (HOSPITAL_COMMUNITY): Payer: Self-pay | Admitting: *Deleted

## 2015-03-05 ENCOUNTER — Ambulatory Visit (INDEPENDENT_AMBULATORY_CARE_PROVIDER_SITE_OTHER): Payer: 59 | Admitting: Psychology

## 2015-03-05 DIAGNOSIS — F900 Attention-deficit hyperactivity disorder, predominantly inattentive type: Secondary | ICD-10-CM | POA: Diagnosis not present

## 2015-03-05 DIAGNOSIS — F331 Major depressive disorder, recurrent, moderate: Secondary | ICD-10-CM | POA: Diagnosis not present

## 2015-03-06 ENCOUNTER — Encounter (HOSPITAL_COMMUNITY): Payer: Self-pay | Admitting: Psychology
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
## 2015-03-19 ENCOUNTER — Encounter (HOSPITAL_COMMUNITY): Payer: Self-pay | Admitting: Psychology

## 2015-03-19 ENCOUNTER — Telehealth (HOSPITAL_COMMUNITY): Payer: Self-pay | Admitting: *Deleted

## 2015-03-19 ENCOUNTER — Ambulatory Visit (INDEPENDENT_AMBULATORY_CARE_PROVIDER_SITE_OTHER): Payer: 59 | Admitting: Psychology

## 2015-03-19 ENCOUNTER — Other Ambulatory Visit (HOSPITAL_COMMUNITY): Payer: Self-pay | Admitting: Psychiatry

## 2015-03-19 ENCOUNTER — Encounter (HOSPITAL_COMMUNITY): Payer: Self-pay | Admitting: *Deleted

## 2015-03-19 DIAGNOSIS — F900 Attention-deficit hyperactivity disorder, predominantly inattentive type: Secondary | ICD-10-CM

## 2015-03-19 DIAGNOSIS — F331 Major depressive disorder, recurrent, moderate: Secondary | ICD-10-CM | POA: Diagnosis not present

## 2015-03-19 MED ORDER — AMPHETAMINE-DEXTROAMPHETAMINE 30 MG PO TABS: 30.0000 mg | ORAL_TABLET | Freq: Two times a day (BID) | ORAL | Status: DC

## 2015-03-19 NOTE — Telephone Encounter (Signed)
PATIENT NEED REFILL OF ADDERALL.   WILL RUN OUT IN 7 DAYS.

## 2015-03-19 NOTE — Telephone Encounter (Signed)
printed
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
## 2015-03-19 NOTE — Telephone Encounter (Signed)
Lmtcb. Number provided 

## 2015-03-19 NOTE — Progress Notes (Signed)
Pt picked up printed script while he was at office for another office visit with another provider. Pt agreed with script. D/L number is [id number] with expiration date of 08-25-21.

## 2015-03-20 ENCOUNTER — Ambulatory Visit (INDEPENDENT_AMBULATORY_CARE_PROVIDER_SITE_OTHER): Payer: 59 | Admitting: Diagnostic Neuroimaging

## 2015-03-20 ENCOUNTER — Encounter (INDEPENDENT_AMBULATORY_CARE_PROVIDER_SITE_OTHER): Payer: Self-pay | Admitting: Diagnostic Neuroimaging

## 2015-03-20 DIAGNOSIS — M25562 Pain in left knee: Secondary | ICD-10-CM

## 2015-03-20 DIAGNOSIS — Z0289 Encounter for other administrative examinations: Secondary | ICD-10-CM

## 2015-03-20 NOTE — Telephone Encounter (Signed)
Pt picked medication up.

## 2015-03-20 NOTE — Procedures (Signed)
   [facility]  NCS (NERVE CONDUCTION STUDY) WITH EMG (ELECTROMYOGRAPHY) REPORT   STUDY DATE: 03/20/15 PATIENT NAME: Marc [name] DOB: [birth year] MRN: [id number]  ORDERING CLINICIAN: Rosine Abe  TECHNOLOGIST: Gearldine Shown  ELECTROMYOGRAPHER: Glenford Bayley. Penumalli, MD  CLINICAL INFORMATION: 49 year old male with back pain and left leg pain.  FINDINGS: NERVE CONDUCTION STUDY: Bilateral peroneal and tibial motor responses and F wave latencies are normal. Bilateral peroneal sensory responses are normal.   NEEDLE ELECTROMYOGRAPHY: Needle examination of left vastus medialis, tibials anterior, gastrocnemius and left L5-S1 paraspinal muscles is normal.   IMPRESSION:  This is a normal study. No electrodiagnostic evidence of large fiber neuropathy or lumbar radiculopathy at this time.    INTERPRETING PHYSICIAN:  Suanne Marker, MD Certified in Neurology, Neurophysiology and Neuroimaging  [facility] [address], [address] [place], [place] [address] [phone]

## 2015-03-25 ENCOUNTER — Ambulatory Visit: Payer: 59 | Admitting: Diagnostic Neuroimaging

## 2015-04-01 ENCOUNTER — Ambulatory Visit: Payer: 59 | Admitting: Diagnostic Neuroimaging

## 2015-04-11 ENCOUNTER — Encounter (HOSPITAL_COMMUNITY): Payer: Self-pay | Admitting: Psychiatry

## 2015-04-11 ENCOUNTER — Ambulatory Visit (INDEPENDENT_AMBULATORY_CARE_PROVIDER_SITE_OTHER): Payer: 59 | Admitting: Psychiatry

## 2015-04-11 VITALS — BP 136/99 | HR 109 | Ht 70.0 in | Wt 198.8 lb

## 2015-04-11 DIAGNOSIS — F900 Attention-deficit hyperactivity disorder, predominantly inattentive type: Secondary | ICD-10-CM

## 2015-04-11 DIAGNOSIS — F329 Major depressive disorder, single episode, unspecified: Secondary | ICD-10-CM

## 2015-04-11 MED ORDER — AMPHETAMINE-DEXTROAMPHETAMINE 30 MG PO TABS: 30.0000 mg | ORAL_TABLET | Freq: Two times a day (BID) | ORAL | Status: DC
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
## 2015-04-16 ENCOUNTER — Encounter (HOSPITAL_COMMUNITY): Payer: Self-pay | Admitting: Psychology

## 2015-04-16 ENCOUNTER — Ambulatory Visit (INDEPENDENT_AMBULATORY_CARE_PROVIDER_SITE_OTHER): Payer: 59 | Admitting: Psychology

## 2015-04-16 DIAGNOSIS — F900 Attention-deficit hyperactivity disorder, predominantly inattentive type: Secondary | ICD-10-CM

## 2015-04-16 DIAGNOSIS — F331 Major depressive disorder, recurrent, moderate: Secondary | ICD-10-CM
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
## 2015-04-30 ENCOUNTER — Ambulatory Visit (HOSPITAL_COMMUNITY): Payer: Self-pay | Admitting: Psychology

## 2015-05-12 ENCOUNTER — Ambulatory Visit (HOSPITAL_COMMUNITY): Payer: Self-pay | Admitting: Psychology

## 2015-07-01 ENCOUNTER — Telehealth (HOSPITAL_COMMUNITY): Payer: Self-pay | Admitting: *Deleted

## 2015-07-01 NOTE — Telephone Encounter (Signed)
[facility], patient is inpatient, was admitted on 06/26/15.

## 2015-07-01 NOTE — Telephone Encounter (Signed)
noted 

## 2015-07-02 NOTE — Telephone Encounter (Signed)
noted 

## 2015-07-11 ENCOUNTER — Ambulatory Visit (HOSPITAL_COMMUNITY): Payer: Self-pay | Admitting: Psychiatry

## 2015-07-17 ENCOUNTER — Telehealth (HOSPITAL_COMMUNITY): Payer: Self-pay | Admitting: *Deleted

## 2015-07-17 NOTE — Telephone Encounter (Signed)
phone call from patient, he has only been getting enough Adderall for 15 days.

## 2015-07-18 ENCOUNTER — Ambulatory Visit (HOSPITAL_COMMUNITY): Payer: Self-pay | Admitting: Psychiatry

## 2015-07-18 ENCOUNTER — Telehealth (HOSPITAL_COMMUNITY): Payer: Self-pay | Admitting: *Deleted

## 2015-07-18 NOTE — Telephone Encounter (Signed)
Pt had an appt with provider but appt had to be resch due to provider being sick. Per pt, he needs refills for his Adderall and Xanax. Pt number is [phone]

## 2015-07-22 ENCOUNTER — Ambulatory Visit (INDEPENDENT_AMBULATORY_CARE_PROVIDER_SITE_OTHER): Payer: 59 | Admitting: Psychiatry

## 2015-07-22 ENCOUNTER — Encounter (HOSPITAL_COMMUNITY): Payer: Self-pay | Admitting: Psychiatry

## 2015-07-22 ENCOUNTER — Other Ambulatory Visit (HOSPITAL_COMMUNITY): Payer: Self-pay | Admitting: Psychiatry

## 2015-07-22 VITALS — BP 120/78 | HR 95 | Ht 70.0 in | Wt 201.6 lb

## 2015-07-22 DIAGNOSIS — F900 Attention-deficit hyperactivity disorder, predominantly inattentive type: Secondary | ICD-10-CM | POA: Diagnosis not present

## 2015-07-22 DIAGNOSIS — F331 Major depressive disorder, recurrent, moderate: Secondary | ICD-10-CM

## 2015-07-22 MED ORDER — DULOXETINE HCL 60 MG PO CPEP: 60.0000 mg | ORAL_CAPSULE | Freq: Two times a day (BID) | ORAL | Status: DC

## 2015-07-22 MED ORDER — AMPHETAMINE-DEXTROAMPHETAMINE 30 MG PO TABS: 30.0000 mg | ORAL_TABLET | Freq: Two times a day (BID) | ORAL | Status: DC

## 2015-07-22 MED ORDER — ALPRAZOLAM 1 MG PO TABS: 1.0000 mg | ORAL_TABLET | Freq: Three times a day (TID) | ORAL | Status: DC

## 2015-07-22 NOTE — Telephone Encounter (Signed)
Pt will be in 07-22-15 and provider will give him his printed script when he comes in
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
## 2015-07-22 NOTE — Telephone Encounter (Signed)
printed

## 2015-07-25 ENCOUNTER — Ambulatory Visit (HOSPITAL_COMMUNITY): Payer: Self-pay | Admitting: Psychology

## 2015-08-04 ENCOUNTER — Ambulatory Visit (HOSPITAL_COMMUNITY): Payer: Self-pay | Admitting: Psychiatry

## 2015-08-19 ENCOUNTER — Encounter (HOSPITAL_COMMUNITY): Payer: Self-pay | Admitting: Psychiatry

## 2015-08-19 ENCOUNTER — Ambulatory Visit (INDEPENDENT_AMBULATORY_CARE_PROVIDER_SITE_OTHER): Payer: 59 | Admitting: Psychiatry

## 2015-08-19 VITALS — BP 125/93 | HR 115 | Ht 70.0 in | Wt 200.4 lb

## 2015-08-19 DIAGNOSIS — F331 Major depressive disorder, recurrent, moderate: Secondary | ICD-10-CM | POA: Diagnosis not present

## 2015-08-19 DIAGNOSIS — F900 Attention-deficit hyperactivity disorder, predominantly inattentive type: Secondary | ICD-10-CM

## 2015-08-19 MED ORDER — AMPHETAMINE-DEXTROAMPHETAMINE 30 MG PO TABS: 30.0000 mg | ORAL_TABLET | Freq: Two times a day (BID) | ORAL | Status: DC
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
## 2015-10-13 ENCOUNTER — Telehealth (HOSPITAL_COMMUNITY): Payer: Self-pay

## 2015-10-14 ENCOUNTER — Telehealth (HOSPITAL_COMMUNITY): Payer: Self-pay | Admitting: *Deleted

## 2015-10-14 MED ORDER — DULOXETINE HCL 60 MG PO CPEP: 60.0000 mg | ORAL_CAPSULE | Freq: Two times a day (BID) | ORAL | Status: DC

## 2015-10-14 NOTE — Telephone Encounter (Signed)
Called pt due to previous call. Per pt, he will be out of his Adderall on 10-15-2015 and need refills and also need refills for his Cymbalta. Per pt, he was on schedule with provider for 10-17-15 but was rescheduled. Informed pt that provider was out of the office and will return on April 24th. Informed pt that provider has to give approval for this medication but [facility] can only send in enough medication to last him until his next visit which is May 2 while provider is out of office. Pt showed understanding. Pt then stated so his Adderall will not be able to get refilled until April 24th when provider gets back into office. Office confirmed and pt verbalized understanding. Pt number is [phone].

## 2015-10-14 NOTE — Telephone Encounter (Signed)
Spoke with pt

## 2015-10-17 ENCOUNTER — Ambulatory Visit (HOSPITAL_COMMUNITY): Payer: Self-pay | Admitting: Psychiatry

## 2015-10-20 ENCOUNTER — Telehealth (HOSPITAL_COMMUNITY): Payer: Self-pay | Admitting: *Deleted

## 2015-10-20 ENCOUNTER — Other Ambulatory Visit (HOSPITAL_COMMUNITY): Payer: Self-pay | Admitting: Psychiatry

## 2015-10-20 ENCOUNTER — Encounter (HOSPITAL_COMMUNITY): Payer: Self-pay | Admitting: *Deleted

## 2015-10-20 MED ORDER — AMPHETAMINE-DEXTROAMPHETAMINE 30 MG PO TABS: 30.0000 mg | ORAL_TABLET | Freq: Two times a day (BID) | ORAL | Status: DC

## 2015-10-20 MED ORDER — ALPRAZOLAM 1 MG PO TABS: 1.0000 mg | ORAL_TABLET | Freq: Three times a day (TID) | ORAL | Status: DC

## 2015-10-20 NOTE — Telephone Encounter (Signed)
Spoke with pt and he is aware printed script is ready for pick up

## 2015-10-20 NOTE — Progress Notes (Signed)
Pt came into office to pick up printed script for his Xanax and Adderall. Pt D/L number is [phone] expiration date of 08-25-2021.

## 2015-10-20 NOTE — Telephone Encounter (Signed)
printed

## 2015-10-20 NOTE — Telephone Encounter (Signed)
Pt called stating he would like refills for his Adderall. Pt was scheduled to come in on 10-17-15 but provider was out of office. Pt is resch to f/u on 10-28-15. Pt number [phone].

## 2015-10-28 ENCOUNTER — Encounter (HOSPITAL_COMMUNITY): Payer: Self-pay | Admitting: *Deleted

## 2015-10-28 ENCOUNTER — Ambulatory Visit (HOSPITAL_COMMUNITY): Payer: Self-pay | Admitting: Psychiatry

## 2015-11-12 ENCOUNTER — Other Ambulatory Visit (HOSPITAL_COMMUNITY): Payer: Self-pay | Admitting: Psychiatry

## 2015-11-12 ENCOUNTER — Telehealth (HOSPITAL_COMMUNITY): Payer: Self-pay | Admitting: *Deleted

## 2015-11-12 MED ORDER — AMPHETAMINE-DEXTROAMPHETAMINE 30 MG PO TABS: 30.0000 mg | ORAL_TABLET | Freq: Two times a day (BID) | ORAL | Status: DC

## 2015-11-12 NOTE — Telephone Encounter (Signed)
printed

## 2015-11-12 NOTE — Telephone Encounter (Signed)
Pt called stating he would like refills for his ADDERALL) 30. Pt medication last printed on 10-17-2015. Pt last appt was 08-19-15. Pt no showed for his appt with Dr. Tenny Crawoss on 10-28-2015 and 07-11-15 and pt no showed for his appt with Dr. Kieth Brightlyodenbough on 07-25-15 and 05-12-2015. Pt number is [phone].

## 2015-11-12 NOTE — Telephone Encounter (Signed)
Will print this if and when he makes another appt

## 2015-11-12 NOTE — Telephone Encounter (Signed)
Pt made appt for May 30th

## 2015-11-13 ENCOUNTER — Encounter (HOSPITAL_COMMUNITY): Payer: Self-pay | Admitting: *Deleted

## 2015-11-13 NOTE — Telephone Encounter (Signed)
Called pt and informed him printed script is ready for pick up. Pt showed understanding.

## 2015-11-13 NOTE — Progress Notes (Signed)
Pt came into office to pick up printed script for his Adderall 30 mg. Pt D/L number is [phone] is expiration 08-25-2021.

## 2015-11-25 ENCOUNTER — Ambulatory Visit (HOSPITAL_COMMUNITY): Payer: Self-pay | Admitting: Psychiatry

## 2015-11-26 ENCOUNTER — Encounter (HOSPITAL_COMMUNITY): Payer: Self-pay | Admitting: Psychiatry

## 2015-11-27 ENCOUNTER — Encounter (HOSPITAL_COMMUNITY): Payer: Self-pay | Admitting: *Deleted

## 2015-12-11 ENCOUNTER — Telehealth (HOSPITAL_COMMUNITY): Payer: Self-pay | Admitting: *Deleted

## 2015-12-11 NOTE — Telephone Encounter (Signed)
returned phone call, left voice message. 

## 2015-12-11 NOTE — Telephone Encounter (Signed)
patient has three No Show with Dr. Tenny Crawoss and four with Dr. Kieth Brightlyodenbough needs to be addressed with patient.

## 2015-12-11 NOTE — Telephone Encounter (Signed)
noted 

## 2015-12-17 ENCOUNTER — Ambulatory Visit (INDEPENDENT_AMBULATORY_CARE_PROVIDER_SITE_OTHER): Payer: 59 | Admitting: Psychiatry

## 2015-12-17 ENCOUNTER — Encounter (HOSPITAL_COMMUNITY): Payer: Self-pay | Admitting: Psychiatry

## 2015-12-17 VITALS — BP 131/79 | HR 88 | Ht 70.0 in | Wt 192.4 lb

## 2015-12-17 DIAGNOSIS — F331 Major depressive disorder, recurrent, moderate: Secondary | ICD-10-CM | POA: Diagnosis not present

## 2015-12-17 DIAGNOSIS — F900 Attention-deficit hyperactivity disorder, predominantly inattentive type: Secondary | ICD-10-CM | POA: Diagnosis not present

## 2015-12-17 MED ORDER — AMPHETAMINE-DEXTROAMPHETAMINE 30 MG PO TABS: 30.0000 mg | ORAL_TABLET | Freq: Two times a day (BID) | ORAL | Status: DC

## 2015-12-17 MED ORDER — ALPRAZOLAM 1 MG PO TABS: 1.0000 mg | ORAL_TABLET | Freq: Three times a day (TID) | ORAL | Status: DC | PRN

## 2015-12-17 MED ORDER — FLUOXETINE HCL 40 MG PO CAPS: 40.0000 mg | ORAL_CAPSULE | Freq: Every day | ORAL | Status: DC
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
## 2016-01-14 ENCOUNTER — Ambulatory Visit (INDEPENDENT_AMBULATORY_CARE_PROVIDER_SITE_OTHER): Payer: 59 | Admitting: Psychiatry

## 2016-01-14 ENCOUNTER — Encounter (HOSPITAL_COMMUNITY): Payer: Self-pay | Admitting: Psychiatry

## 2016-01-14 VITALS — BP 122/86 | HR 92 | Ht 70.0 in | Wt 194.8 lb

## 2016-01-14 DIAGNOSIS — F900 Attention-deficit hyperactivity disorder, predominantly inattentive type: Secondary | ICD-10-CM

## 2016-01-14 DIAGNOSIS — F331 Major depressive disorder, recurrent, moderate: Secondary | ICD-10-CM

## 2016-01-14 MED ORDER — AMPHETAMINE-DEXTROAMPHETAMINE 30 MG PO TABS: 30.0000 mg | ORAL_TABLET | Freq: Two times a day (BID) | ORAL | Status: DC

## 2016-01-14 MED ORDER — FLUOXETINE HCL 40 MG PO CAPS: 40.0000 mg | ORAL_CAPSULE | Freq: Every day | ORAL | Status: DC

## 2016-01-14 MED ORDER — ALPRAZOLAM 1 MG PO TABS: 1.0000 mg | ORAL_TABLET | Freq: Three times a day (TID) | ORAL | Status: DC | PRN
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
## 2016-03-10 ENCOUNTER — Telehealth (HOSPITAL_COMMUNITY): Payer: Self-pay | Admitting: *Deleted

## 2016-03-10 NOTE — Telephone Encounter (Signed)
Pt called stating his Adderall will run out and he will be 5-6 days out.

## 2016-03-11 NOTE — Telephone Encounter (Signed)
[facility] pt [facility] and informed her when pt does bring his 03-15-16 script provider is approving they can fill on the 18th. Cyndi verbalized understanding.

## 2016-03-11 NOTE — Telephone Encounter (Signed)
noted 

## 2016-03-11 NOTE — Telephone Encounter (Signed)
Pt called stating he do not have refills for his Adderall. Per pt, this happens every month. Per pt, he will be 5-6 days without his medication and do not understand as to why this keeps happening every month. Per pt, he went one time for a week without his medication and if the month have 31 days, he ends up short. Per pt he would like for Dr. Tenny Crawoss to give him more refills. Informed pt that per his chart, he should not be out of his Adderall for 5-6 days and yes Aug did have 31 days which would make his pick up date for September 18 th but shout not be out of medication for 5-6 days. Per pt, he will be out for 5-6 days. Informed pt RMA will call his pharmacy and pt verbalized understand. Called pt pharmacy and spoke with Lorin PicketScott the pharmacist. Per Lorin PicketScott, per their file, pt have not gone at anytime without his Adderall and they have a note on their file to ALWAYS count pt tablets every month and he is 100 percent sure that pt do receive 60 tabs every fill. Per Lorin PicketScott, pt last pick up was 02-14-16 and do not have script for 03-16-16 on file. Per Lorin PicketScott, if pt does have his script, and on the script says 03-16-16, pt can not fill that script until 03-16-16 unless provider approves for pt to fill it on 03-15-16 instead. Called pt on his home number. Informed pt that his pharmacy do not have the script for 03-16-16. Asked pt if he still have that script and he stated yes. Informed to he should take his script to his pharmacy when he need refills. Per pt, can he refill his script before the 19 th then. Informed pt he should not be out of his medication before the 19 th and if anything he should be out on 03-15-16. Pt then stated okay.

## 2016-03-11 NOTE — Telephone Encounter (Signed)
You can tell pharm,acy to fill on 9/18

## 2016-04-15 ENCOUNTER — Encounter (HOSPITAL_COMMUNITY): Payer: Self-pay | Admitting: Psychiatry

## 2016-04-15 ENCOUNTER — Ambulatory Visit (INDEPENDENT_AMBULATORY_CARE_PROVIDER_SITE_OTHER): Payer: 59 | Admitting: Psychiatry

## 2016-04-15 VITALS — BP 127/92 | HR 82 | Ht 70.0 in | Wt 210.0 lb

## 2016-04-15 DIAGNOSIS — F900 Attention-deficit hyperactivity disorder, predominantly inattentive type: Secondary | ICD-10-CM

## 2016-04-15 DIAGNOSIS — Z818 Family history of other mental and behavioral disorders: Secondary | ICD-10-CM | POA: Diagnosis not present

## 2016-04-15 DIAGNOSIS — F331 Major depressive disorder, recurrent, moderate: Secondary | ICD-10-CM | POA: Diagnosis not present

## 2016-04-15 MED ORDER — AMPHETAMINE-DEXTROAMPHETAMINE 30 MG PO TABS: 30.0000 mg | ORAL_TABLET | Freq: Two times a day (BID) | ORAL | 0 refills | Status: DC

## 2016-04-15 MED ORDER — ALPRAZOLAM 1 MG PO TABS: 1.0000 mg | ORAL_TABLET | Freq: Three times a day (TID) | ORAL | 2 refills | Status: DC | PRN

## 2016-04-15 MED ORDER — FLUOXETINE HCL 40 MG PO CAPS: 40.0000 mg | ORAL_CAPSULE | Freq: Every day | ORAL | 2 refills | Status: DC
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
## 2016-07-16 ENCOUNTER — Ambulatory Visit (HOSPITAL_COMMUNITY): Payer: Self-pay | Admitting: Psychiatry

## 2016-07-29 ENCOUNTER — Encounter (HOSPITAL_COMMUNITY): Payer: Self-pay | Admitting: Psychiatry

## 2016-07-29 ENCOUNTER — Ambulatory Visit (INDEPENDENT_AMBULATORY_CARE_PROVIDER_SITE_OTHER): Payer: 59 | Admitting: Psychiatry

## 2016-07-29 VITALS — BP 162/111 | HR 111 | Ht 70.0 in | Wt 215.0 lb

## 2016-07-29 DIAGNOSIS — F909 Attention-deficit hyperactivity disorder, unspecified type: Secondary | ICD-10-CM

## 2016-07-29 DIAGNOSIS — Z818 Family history of other mental and behavioral disorders: Secondary | ICD-10-CM | POA: Diagnosis not present

## 2016-07-29 DIAGNOSIS — F331 Major depressive disorder, recurrent, moderate: Secondary | ICD-10-CM

## 2016-07-29 DIAGNOSIS — Z79899 Other long term (current) drug therapy: Secondary | ICD-10-CM

## 2016-07-29 MED ORDER — AMPHETAMINE-DEXTROAMPHETAMINE 30 MG PO TABS: 30.0000 mg | ORAL_TABLET | Freq: Two times a day (BID) | ORAL | 0 refills | Status: AC

## 2016-07-29 MED ORDER — FLUOXETINE HCL 40 MG PO CAPS: 40.0000 mg | ORAL_CAPSULE | Freq: Every day | ORAL | 2 refills | Status: AC

## 2016-07-29 MED ORDER — ALPRAZOLAM 1 MG PO TABS: 1.0000 mg | ORAL_TABLET | Freq: Three times a day (TID) | ORAL | 2 refills | Status: AC | PRN
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
## 2016-10-25 ENCOUNTER — Ambulatory Visit (HOSPITAL_COMMUNITY): Payer: Self-pay | Admitting: Psychiatry

## 2016-12-21 IMAGING — MR MR THORACIC SPINE W/O CM
3 of 7 series · 9 of 48 positions shown · non-contrast
Comparison: None.

CLINICAL DATA: Pain over the entire spine. Extension and pain and
weakness into both arms.

EXAM:
MRI THORACIC SPINE WITHOUT CONTRAST
TECHNIQUE: Multiplanar, multisequence MR imaging of the thoracic spine was
performed. No intravenous contrast was administered.

[Series 5: T1 · sagittal · 4.0mm · 0.49mm/px · 3 of 13 slices shown]
[im 1/13]
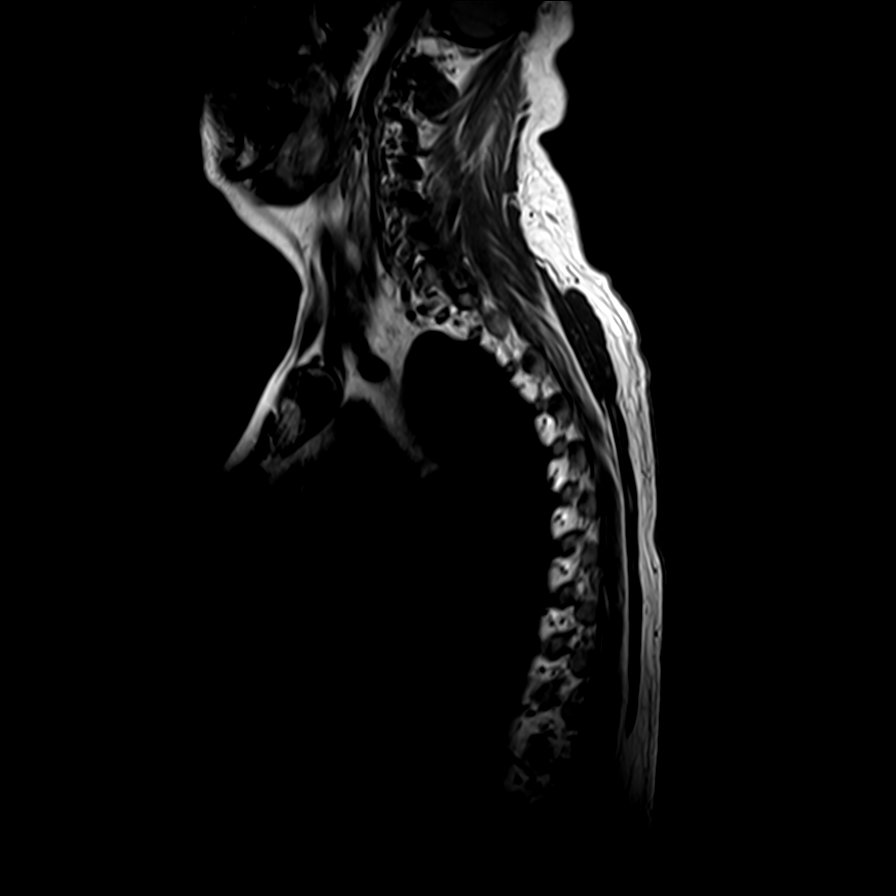
[im 9/13]
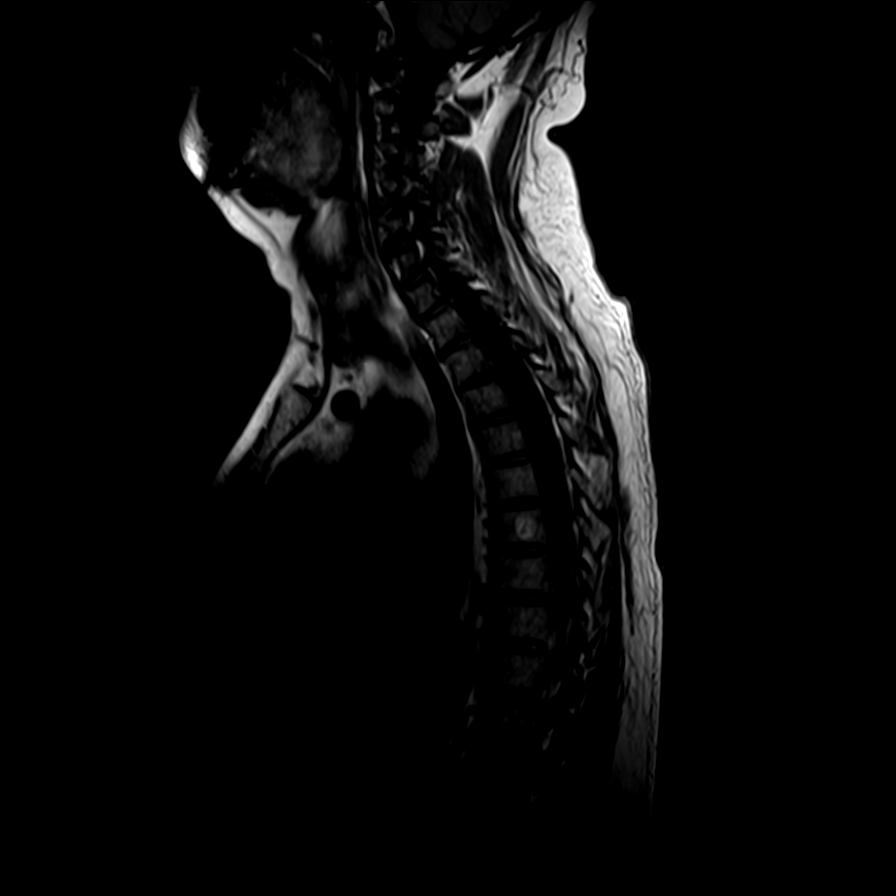
[im 13/13]
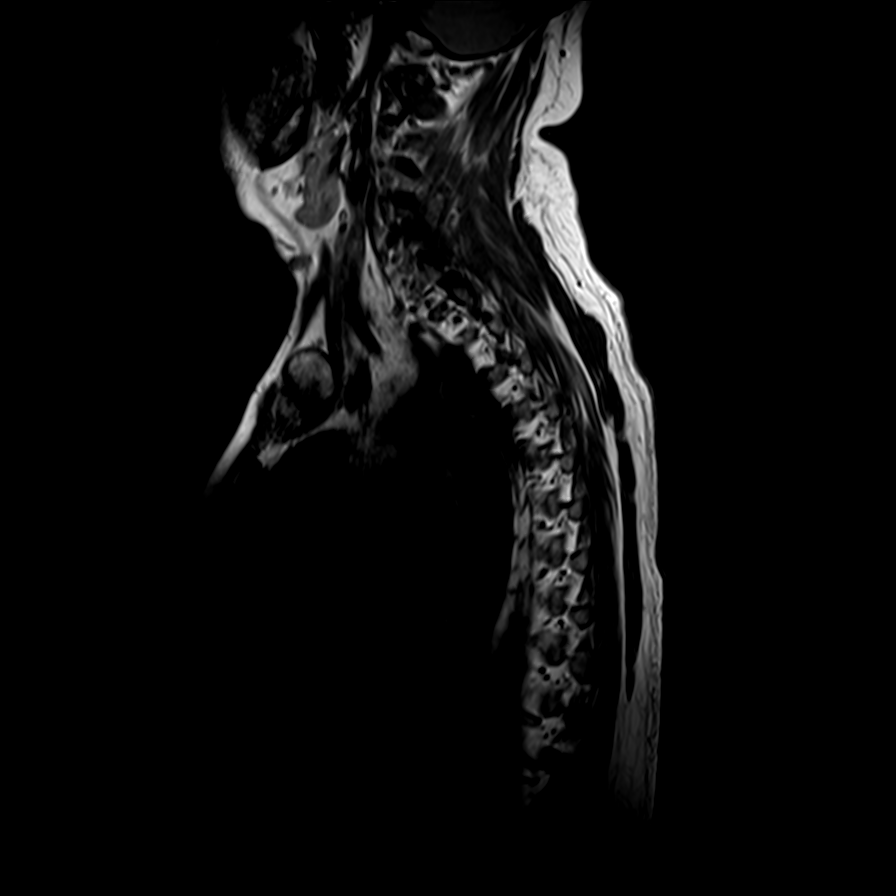

[Series 6: T2 · sagittal · 4.0mm · 0.38mm/px · 3 of 13 slices shown (1 of 2)]
[im 1/13]
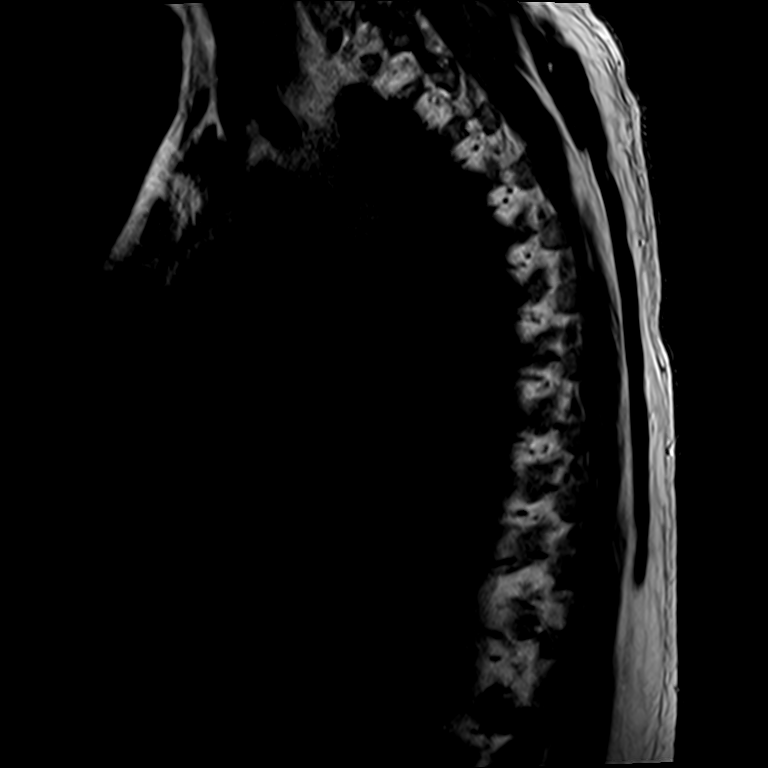
[im 7/13]
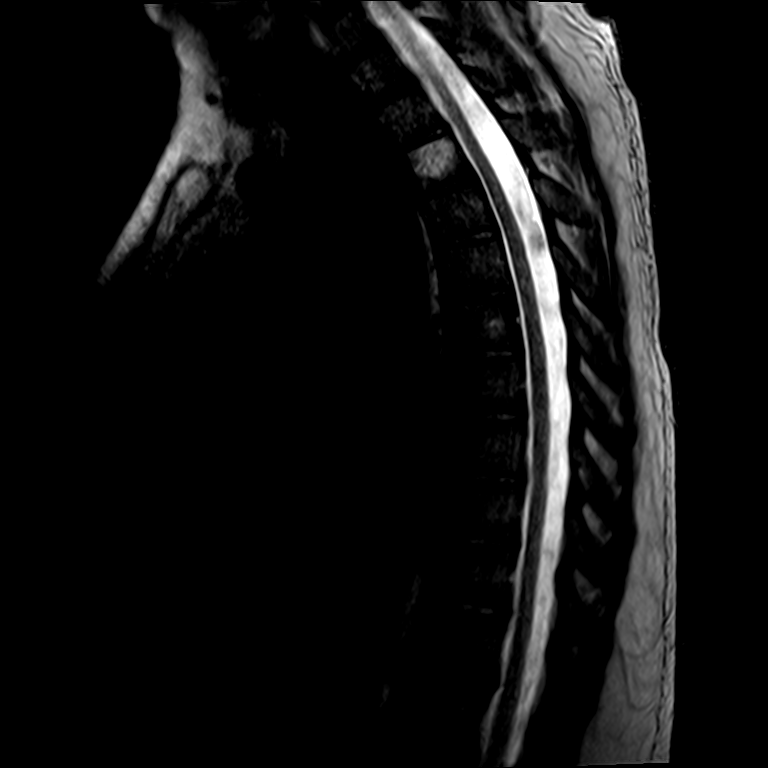
[im 13/13]
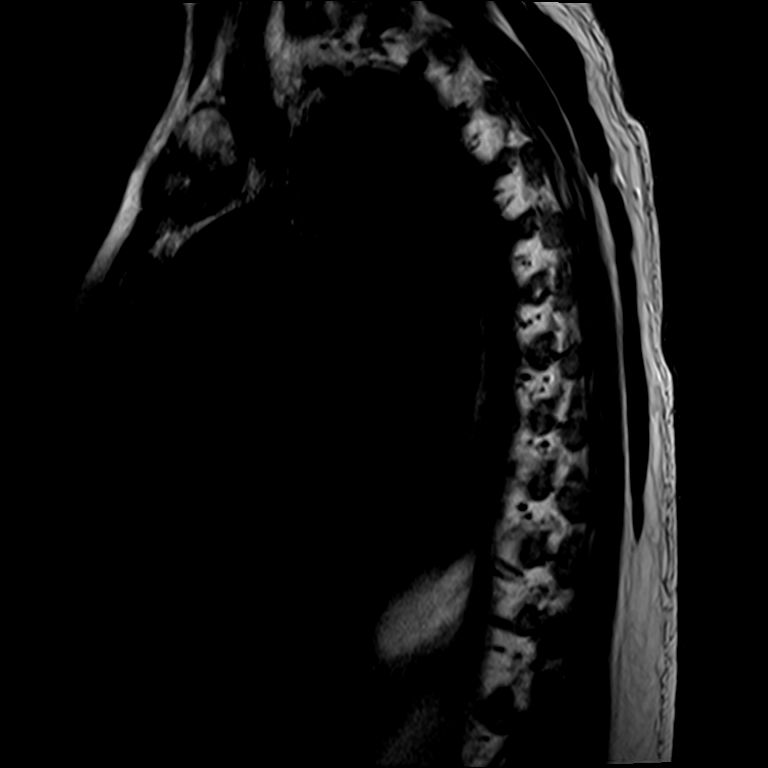

[Series 9: T2 · axial · 3.0mm · 0.27mm/px · z∈[-277,-133]mm · 3 of 33 slices shown (2 of 2)]
[im 6/33]
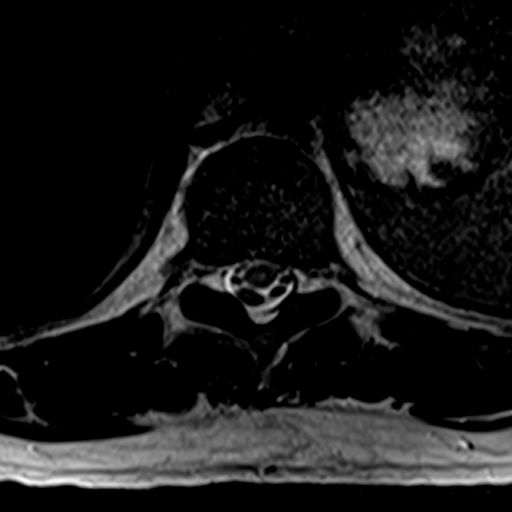
[im 18/33]
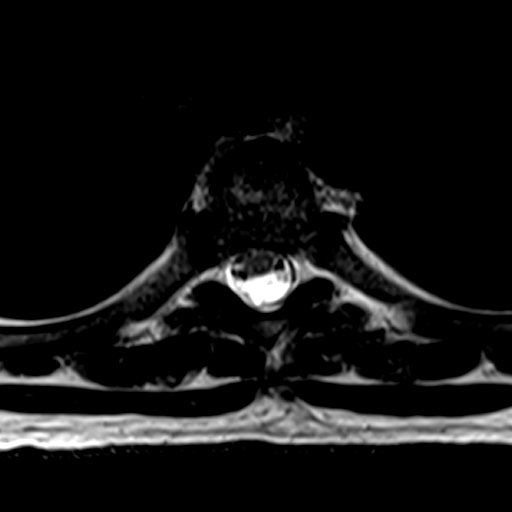
[im 30/33]
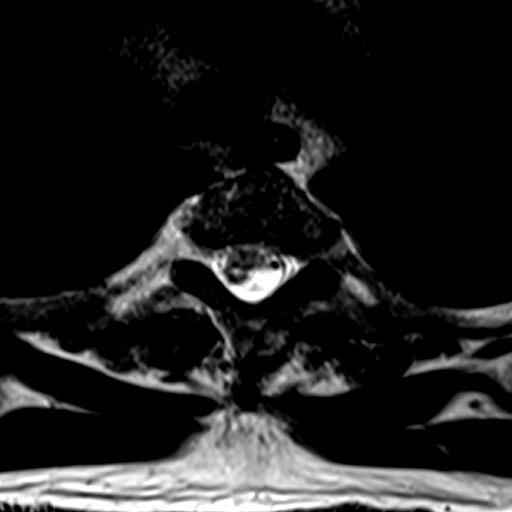

[9 of 48 positions shown; findings below may reference images not displayed]

FINDINGS: The vertebral body heights are maintained. The alignment is
anatomic. There is no acute fracture or static listhesis. There is a
T1 and T2 hyperintense lesion in the T3 and T6 vertebral body
consistent with hemangiomas. The marrow signal is otherwise normal.
The thoracic spinal cord is normal in size and signal. The disc
spaces are maintained.

T1-T2: No significant disc protrusion, foraminal stenosis or central
canal stenosis.

T2-T3: No significant disc protrusion, foraminal stenosis or central
canal stenosis.

T3-T4: No significant disc protrusion, foraminal stenosis or central
canal stenosis.

T4-T5: No significant disc protrusion, foraminal stenosis or central
canal stenosis.

T5-T6: No significant disc protrusion, foraminal stenosis or central
canal stenosis.

T6-T7: No significant disc protrusion, foraminal stenosis or central
canal stenosis.

T7-T8: No significant disc protrusion, foraminal stenosis or central
canal stenosis.

T8-T9: No significant disc protrusion, foraminal stenosis or central
canal stenosis.

T9-T10: No significant disc protrusion, foraminal stenosis or
central canal stenosis.

T10-T11: No significant disc protrusion, foraminal stenosis or
central canal stenosis.

T11-T12: Mild broad-based disc bulge. No foraminal or central canal
stenosis.

T12-L1: Mild broad-based disc bulge. No foraminal or central canal
stenosis.
IMPRESSION: 1. No acute osseous injury of the thoracic spine.
2. No significant thoracic spine disc protrusion, foraminal stenosis
or central canal stenosis.

## 2016-12-21 IMAGING — MR MR CERVICAL SPINE W/O CM
4 of 5 series · 14 of 48 positions shown · non-contrast
Comparison: MRI cervical spine 02/02/2014.

CLINICAL DATA: Pain radiating from the neck through the entire
spine. Numbness and weakness in both arms. Symptoms are chronic. No
recent injury. Subsequent encounter.

EXAM:
MRI CERVICAL SPINE WITHOUT CONTRAST
TECHNIQUE: Multiplanar, multisequence MR imaging of the cervical spine was
performed. No intravenous contrast was administered.

[Series 2: T2 · sagittal · 3.0mm · 0.40mm/px · 5 of 13 slices shown (1 of 2)]
[im 1/13]
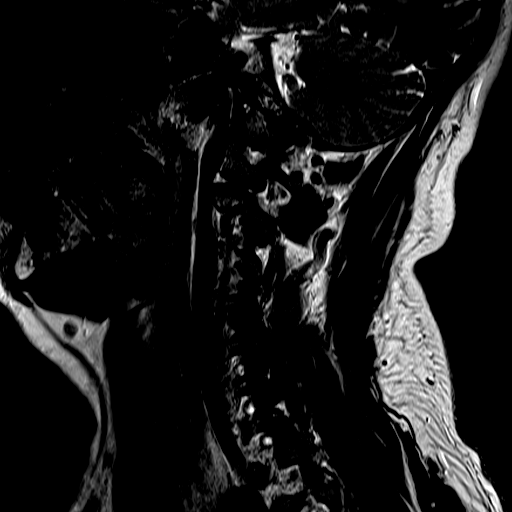
[im 4/13]
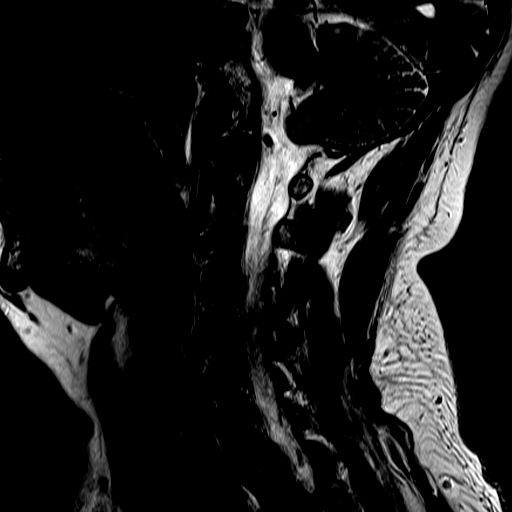
[im 7/13]
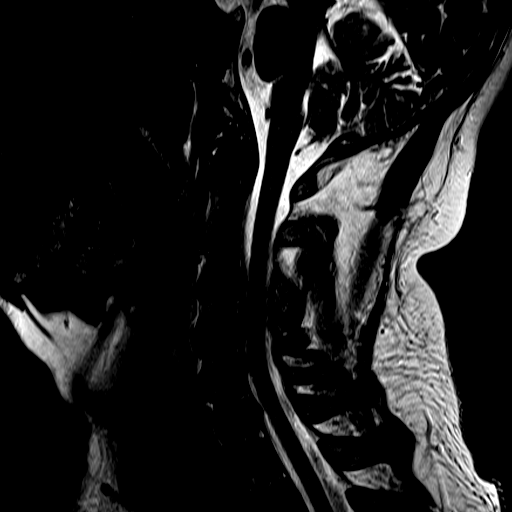
[im 10/13]
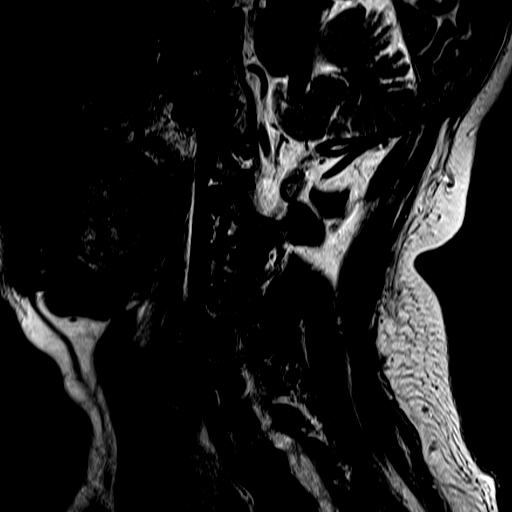
[im 13/13]
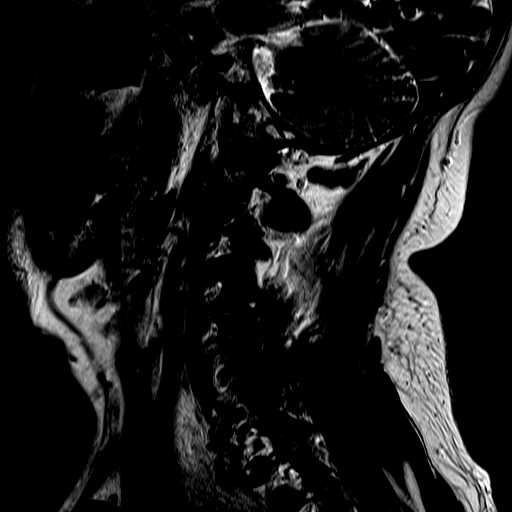

[Series 3: FLAIR · sagittal · 3.0mm · 0.47mm/px · 3 of 13 slices shown]
[im 1/13]
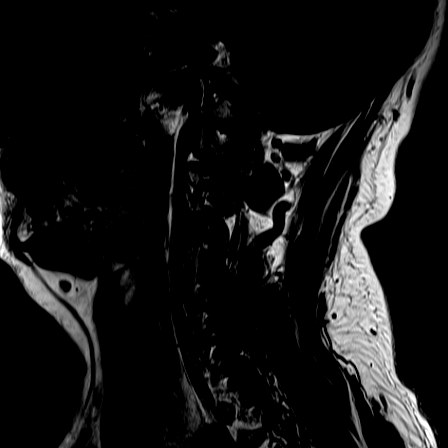
[im 7/13]
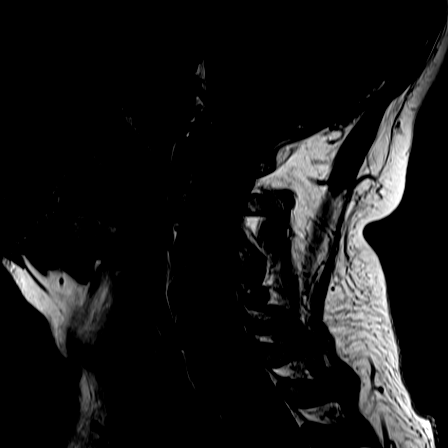
[im 13/13]
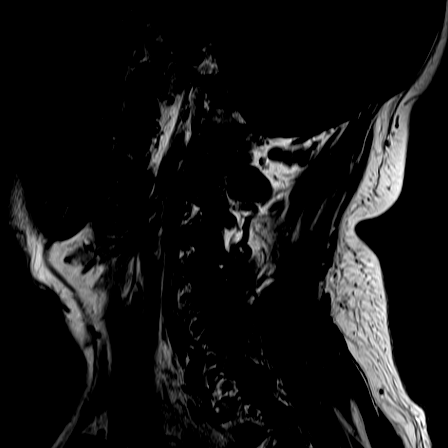

[Series 4: ir sagital · sagittal · 3.0mm · 0.24mm/px · 3 of 13 slices shown]
[im 3/13]
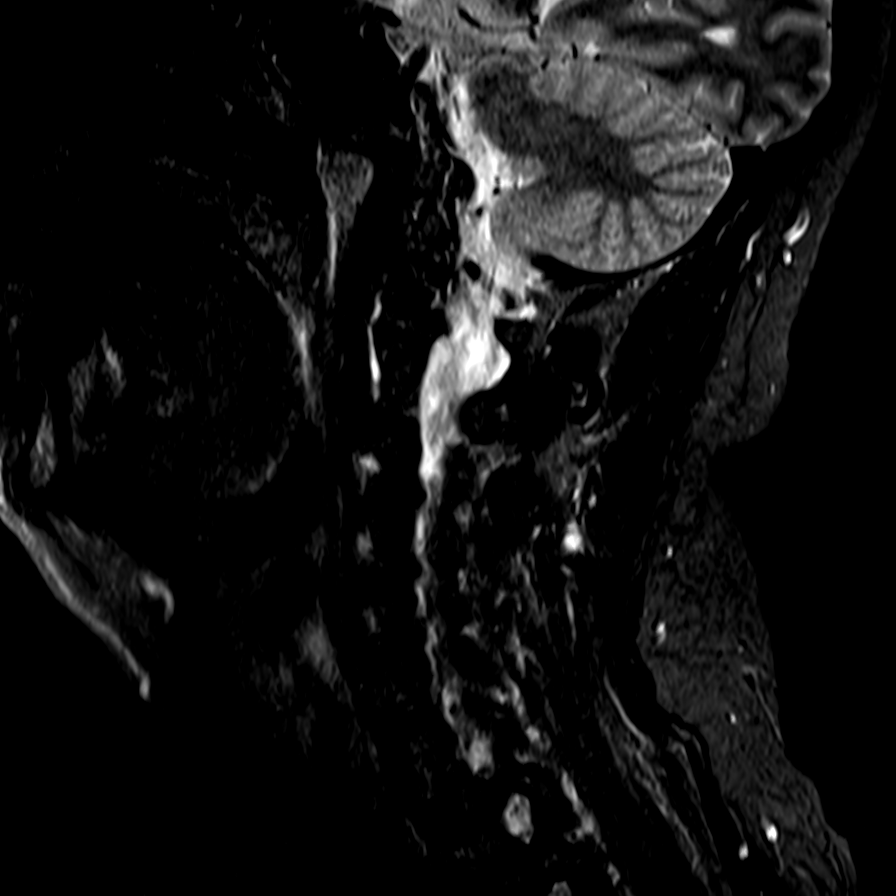
[im 8/13]
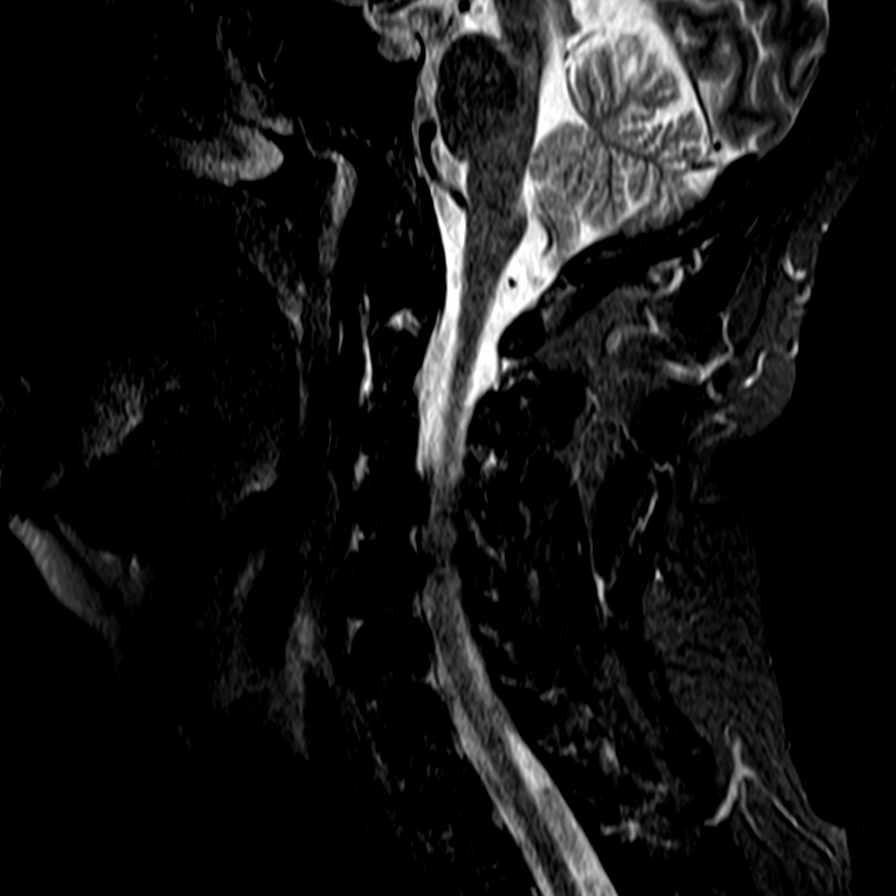
[im 13/13]
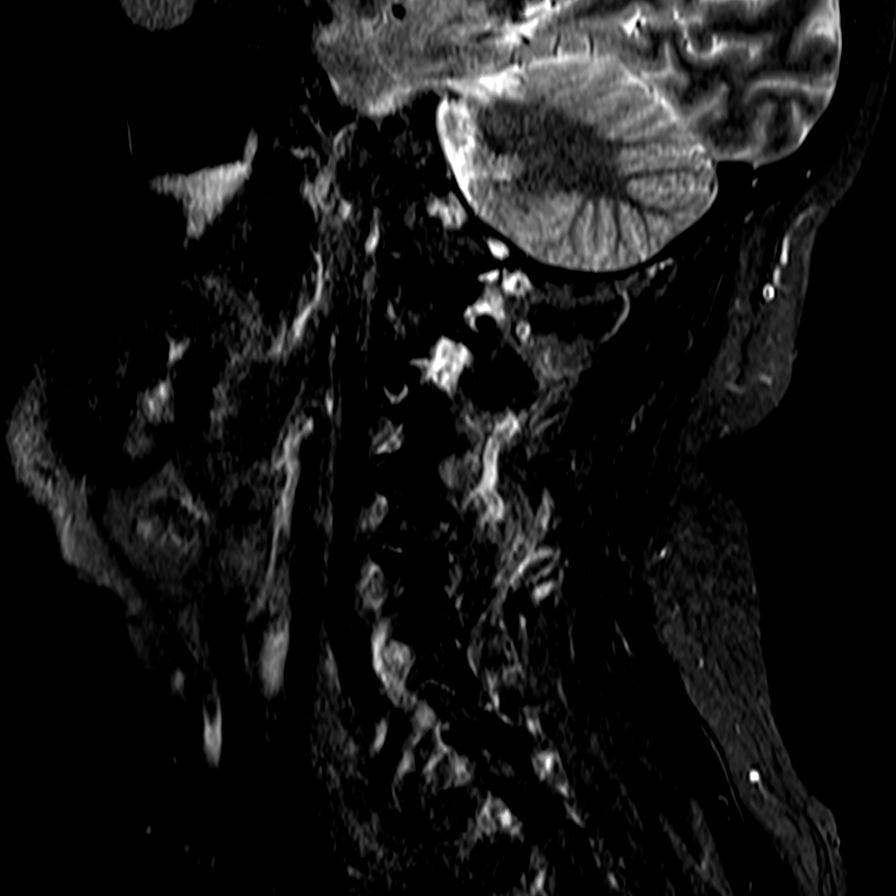

[Series 6: T2 · axial · 3.0mm · 0.22mm/px · z∈[-56,+28]mm · 3 of 36 slices shown (2 of 2)]
[im 5/36]
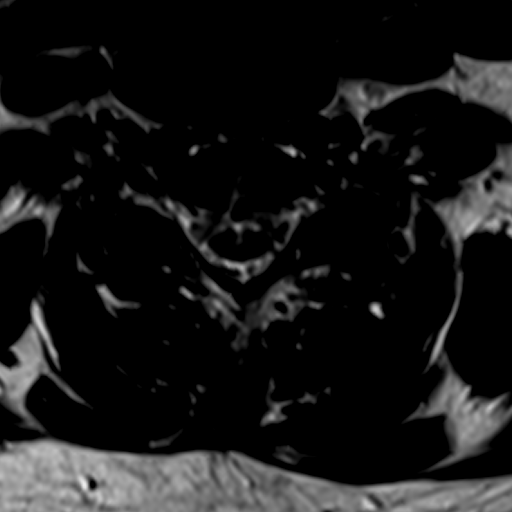
[im 19/36]
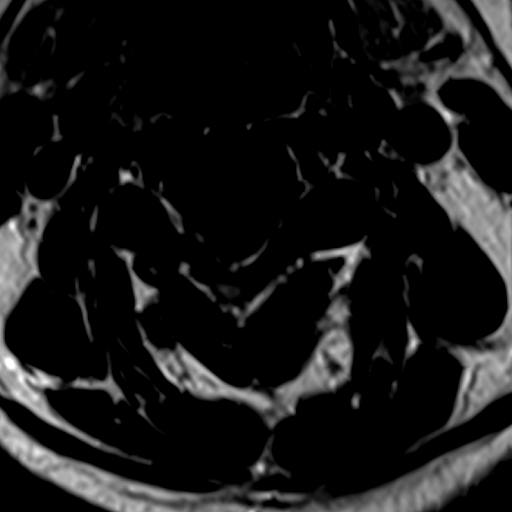
[im 31/36]
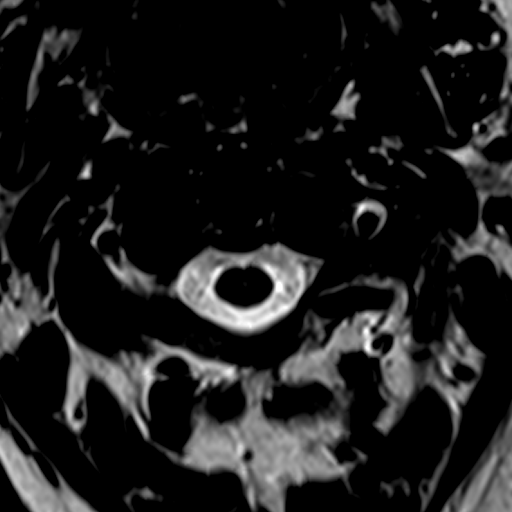

[14 of 48 positions shown; findings below may reference images not displayed]

FINDINGS: Trace retrolisthesis C3 on C4 is unchanged. Alignment is otherwise
normal. Vertebral body height and signal are maintained. The
craniocervical junction is normal and cervical cord signal is
normal. Mucosal thickening in the inferior aspect of the left
maxillary sinus is incompletely visualized. Imaged paraspinous
structures appear normal.

C2-3:  Negative.

C3-4: Shallow disc bulge is unchanged. The central canal remains
open. Uncovertebral disease causes mild to moderate foraminal
narrowing, worse on the left.

C4-5: Shallow disc bulge effacing the ventral thecal sac and
uncovertebral disease appear unchanged. Mild to moderate bilateral
foraminal narrowing is identified.

C5-6: Very shallow disc bulge without central canal or foraminal
stenosis.

C6-7: A small central protrusion without central canal or foraminal
stenosis is unchanged.

C7-T1: Incompletely imaged in the axial plane. The central canal and
foramina appear widely patent.
IMPRESSION: Uncovertebral disease causes mild to moderate bilateral foraminal
narrowing at C3-4, worse on the left. The appearance is not notably
changed.

Shallow disc bulge effacing the ventral thecal sac and mild to
moderate bilateral foraminal narrowing C4-5, unchanged.

Small central protrusion C6-7 without central canal or foraminal
stenosis.

No new abnormality since the prior exam.

Mucosal thickening left maxillary sinus.

## 2017-12-26 DEATH — deceased
